# Patient Record
Sex: Female | Born: 1935 | Race: White | Hispanic: No | Marital: Single | Smoking: Former smoker
Health system: Southern US, Community
[De-identification: ages and names within clinical notes are randomized; demographics above are authoritative.]

## PROBLEM LIST (undated history)

## (undated) DIAGNOSIS — M8580 Other specified disorders of bone density and structure, unspecified site: Secondary | ICD-10-CM

## (undated) DIAGNOSIS — E079 Disorder of thyroid, unspecified: Secondary | ICD-10-CM

## (undated) DIAGNOSIS — M545 Low back pain, unspecified: Secondary | ICD-10-CM

## (undated) DIAGNOSIS — R002 Palpitations: Secondary | ICD-10-CM

## (undated) DIAGNOSIS — L409 Psoriasis, unspecified: Secondary | ICD-10-CM

## (undated) DIAGNOSIS — I4891 Unspecified atrial fibrillation: Secondary | ICD-10-CM

## (undated) DIAGNOSIS — G309 Alzheimer's disease, unspecified: Secondary | ICD-10-CM

## (undated) DIAGNOSIS — F0280 Dementia in other diseases classified elsewhere without behavioral disturbance: Secondary | ICD-10-CM

## (undated) DIAGNOSIS — I499 Cardiac arrhythmia, unspecified: Secondary | ICD-10-CM

## (undated) DIAGNOSIS — I639 Cerebral infarction, unspecified: Secondary | ICD-10-CM

## (undated) DIAGNOSIS — I10 Essential (primary) hypertension: Secondary | ICD-10-CM

## (undated) DIAGNOSIS — E785 Hyperlipidemia, unspecified: Secondary | ICD-10-CM

## (undated) HISTORY — DX: Other specified disorders of bone density and structure, unspecified site: M85.80

## (undated) HISTORY — DX: Hyperlipidemia, unspecified: E78.5

## (undated) HISTORY — DX: Low back pain: M54.5

## (undated) HISTORY — DX: Low back pain, unspecified: M54.50

## (undated) HISTORY — DX: Psoriasis, unspecified: L40.9

## (undated) HISTORY — PX: CLEFT PALATE REPAIR: SUR1165

## (undated) HISTORY — DX: Dementia in other diseases classified elsewhere, unspecified severity, without behavioral disturbance, psychotic disturbance, mood disturbance, and anxiety: F02.80

## (undated) HISTORY — DX: Cardiac arrhythmia, unspecified: I49.9

## (undated) HISTORY — DX: Disorder of thyroid, unspecified: E07.9

## (undated) HISTORY — DX: Palpitations: R00.2

## (undated) HISTORY — DX: Alzheimer's disease, unspecified: G30.9

---

## 1978-11-25 HISTORY — PX: MYOMECTOMY: SHX85

## 1997-11-25 HISTORY — PX: COLONOSCOPY: SHX174

## 2001-10-11 ENCOUNTER — Encounter: Payer: Self-pay | Admitting: Orthopedic Surgery

## 2001-10-11 ENCOUNTER — Encounter: Admission: RE | Admit: 2001-10-11 | Discharge: 2001-10-11 | Payer: Self-pay | Admitting: Orthopedic Surgery

## 2001-12-02 ENCOUNTER — Encounter: Admission: RE | Admit: 2001-12-02 | Discharge: 2001-12-02 | Payer: Self-pay | Admitting: Orthopedic Surgery

## 2001-12-02 ENCOUNTER — Encounter: Payer: Self-pay | Admitting: Orthopedic Surgery

## 2001-12-16 ENCOUNTER — Encounter: Admission: RE | Admit: 2001-12-16 | Discharge: 2001-12-16 | Payer: Self-pay | Admitting: Orthopedic Surgery

## 2001-12-16 ENCOUNTER — Encounter: Payer: Self-pay | Admitting: Orthopedic Surgery

## 2002-01-06 ENCOUNTER — Encounter: Payer: Self-pay | Admitting: Orthopedic Surgery

## 2002-01-06 ENCOUNTER — Encounter: Admission: RE | Admit: 2002-01-06 | Discharge: 2002-01-06 | Payer: Self-pay | Admitting: Orthopedic Surgery

## 2002-03-17 ENCOUNTER — Encounter: Payer: Self-pay | Admitting: Orthopedic Surgery

## 2002-03-18 ENCOUNTER — Inpatient Hospital Stay (HOSPITAL_COMMUNITY): Admission: RE | Admit: 2002-03-18 | Discharge: 2002-03-21 | Payer: Self-pay | Admitting: Orthopedic Surgery

## 2002-03-18 ENCOUNTER — Encounter: Payer: Self-pay | Admitting: Orthopedic Surgery

## 2002-07-07 ENCOUNTER — Encounter: Payer: Self-pay | Admitting: Emergency Medicine

## 2002-07-08 ENCOUNTER — Inpatient Hospital Stay (HOSPITAL_COMMUNITY): Admission: EM | Admit: 2002-07-08 | Discharge: 2002-07-13 | Payer: Self-pay | Admitting: Emergency Medicine

## 2002-07-08 ENCOUNTER — Encounter: Payer: Self-pay | Admitting: Orthopaedic Surgery

## 2002-07-13 ENCOUNTER — Inpatient Hospital Stay (HOSPITAL_COMMUNITY)
Admission: RE | Admit: 2002-07-13 | Discharge: 2002-07-19 | Payer: Self-pay | Admitting: Physical Medicine & Rehabilitation

## 2002-08-25 HISTORY — PX: ORIF HIP FRACTURE: SHX2125

## 2004-03-28 ENCOUNTER — Encounter: Admission: RE | Admit: 2004-03-28 | Discharge: 2004-03-28 | Payer: Self-pay | Admitting: Internal Medicine

## 2007-03-14 ENCOUNTER — Emergency Department (HOSPITAL_COMMUNITY): Admission: EM | Admit: 2007-03-14 | Discharge: 2007-03-14 | Payer: Self-pay | Admitting: Emergency Medicine

## 2007-04-14 ENCOUNTER — Encounter: Admission: RE | Admit: 2007-04-14 | Discharge: 2007-04-14 | Payer: Self-pay | Admitting: Internal Medicine

## 2008-01-19 ENCOUNTER — Emergency Department (HOSPITAL_COMMUNITY): Admission: EM | Admit: 2008-01-19 | Discharge: 2008-01-19 | Payer: Self-pay | Admitting: Emergency Medicine

## 2008-08-25 HISTORY — PX: CATARACT EXTRACTION: SUR2

## 2010-07-25 ENCOUNTER — Emergency Department: Payer: Self-pay | Admitting: Emergency Medicine

## 2010-12-16 ENCOUNTER — Encounter (HOSPITAL_BASED_OUTPATIENT_CLINIC_OR_DEPARTMENT_OTHER): Payer: Self-pay | Admitting: Internal Medicine

## 2011-02-24 DIAGNOSIS — I499 Cardiac arrhythmia, unspecified: Secondary | ICD-10-CM

## 2011-02-24 HISTORY — DX: Cardiac arrhythmia, unspecified: I49.9

## 2011-03-01 ENCOUNTER — Encounter: Payer: Self-pay | Admitting: Cardiovascular Disease

## 2011-03-02 ENCOUNTER — Encounter: Payer: Self-pay | Admitting: Cardiovascular Disease

## 2011-03-02 ENCOUNTER — Inpatient Hospital Stay: Payer: Self-pay | Admitting: Internal Medicine

## 2011-03-02 DIAGNOSIS — I498 Other specified cardiac arrhythmias: Secondary | ICD-10-CM

## 2011-03-02 DIAGNOSIS — I4891 Unspecified atrial fibrillation: Secondary | ICD-10-CM

## 2011-03-03 ENCOUNTER — Encounter: Payer: Self-pay | Admitting: Cardiovascular Disease

## 2011-03-11 ENCOUNTER — Telehealth: Payer: Self-pay | Admitting: Emergency Medicine

## 2011-03-11 NOTE — Telephone Encounter (Signed)
Pt's son called today stating that pt had to go back to the hospital and once pt was out she had a f/u with Dr.Patterson. Dr.Patterson put her on Pradaxa at this time. The son wanted me to let Dr.Gollan know this and pt has appt Friday 03/22/2011 @ 3.

## 2011-03-14 ENCOUNTER — Ambulatory Visit: Payer: PRIVATE HEALTH INSURANCE | Admitting: Cardiovascular Disease

## 2011-03-22 ENCOUNTER — Ambulatory Visit (INDEPENDENT_AMBULATORY_CARE_PROVIDER_SITE_OTHER): Payer: 59 | Admitting: Cardiovascular Disease

## 2011-03-22 ENCOUNTER — Encounter: Payer: Self-pay | Admitting: Cardiovascular Disease

## 2011-03-22 VITALS — BP 108/62 | HR 77 | Ht 66.0 in | Wt 146.4 lb

## 2011-03-22 DIAGNOSIS — I059 Rheumatic mitral valve disease, unspecified: Secondary | ICD-10-CM

## 2011-03-22 DIAGNOSIS — E785 Hyperlipidemia, unspecified: Secondary | ICD-10-CM | POA: Insufficient documentation

## 2011-03-22 DIAGNOSIS — I340 Nonrheumatic mitral (valve) insufficiency: Secondary | ICD-10-CM

## 2011-03-22 DIAGNOSIS — I4891 Unspecified atrial fibrillation: Secondary | ICD-10-CM | POA: Insufficient documentation

## 2011-03-22 NOTE — Progress Notes (Signed)
   Patient ID: Dawn Bradshaw, female    DOB: 02-14-1927, 77 y.o.   MRN: [id number]  HPI Comments: Ms. Balducci is a very pleasant 77 year old woman with chronic atrial fibrillation, recent admission to the hospital for abdominal pain on April 7 also a history of dementia, hypertension. In the hospital she was noted to have bradycardia and her atenolol was discontinued with improvement of her balance and her bradycardia. She presents to establish care. She was started on pradaxa in the hospital.  Overall, she reports doing well. She presents with her son. She currently lives at the [place]. Her balance is better since stopping the atenolol. She denies any significant shortness of breath or lightheadedness. She does not have any significant edema. She does have some fatigue and has been sleeping a lot though her son believes it is from the recent loss of her niece who is 58 years old and recently passed away. They were very close.  She does have a remote smoking history for 20 years ago quit.  Echocardiogram shows normal systolic function, severely dilated left atrium, moderately dilated right atrium, moderate to severe MR, moderate TR with mildly elevated right ventricular systolic pressures. Echocardiogram is read by Gavin Potters MD.  EKG today shows atrial fibrillation with ventricular rate 77 beats per minute with no significant ST or T wave changes.     Review of Systems  Constitutional: Positive for fatigue.  HENT: Negative.   Eyes: Negative.   Respiratory: Negative.   Cardiovascular: Negative.   Gastrointestinal: Negative.   Musculoskeletal: Positive for gait problem.  Skin: Negative.   Neurological: Negative.   Hematological: Negative.   Psychiatric/Behavioral: Negative.   All other systems reviewed and are negative.   BP 108/62  Pulse 77  Ht 5\' 6"  (1.676 m)  Wt 146 lb 6.4 oz (66.407 kg)  BMI 23.63 kg/m2   Physical Exam  Nursing note and vitals reviewed. Constitutional: She is  oriented to person, place, and time. She appears well-developed and well-nourished.  HENT:  Head: Normocephalic.  Nose: Nose normal.  Mouth/Throat: Oropharynx is clear and moist.  Eyes: Conjunctivae are normal. Pupils are equal, round, and reactive to light.  Neck: Normal range of motion. Neck supple. No JVD present.  Cardiovascular: Normal rate and intact distal pulses.  An irregularly irregular rhythm present. Exam reveals no gallop and no friction rub.   Murmur heard.  Crescendo systolic murmur is present with a grade of 2/6  Pulmonary/Chest: Effort normal and breath sounds normal. No respiratory distress. She has no wheezes. She has no rales. She exhibits no tenderness.  Abdominal: Soft. Bowel sounds are normal. She exhibits no distension. There is no tenderness.  Musculoskeletal: Normal range of motion. She exhibits no edema and no tenderness.  Lymphadenopathy:    She has no cervical adenopathy.  Neurological: She is alert and oriented to person, place, and time. Coordination normal.  Skin: Skin is warm and dry. No rash noted. No erythema.  Psychiatric: She has a normal mood and affect. Her behavior is normal. Judgment and thought content normal.         Assessment and Plan

## 2011-03-22 NOTE — Assessment & Plan Note (Addendum)
Echocardiogram read by outside physician in the hospital suggest significant mitral regurg, dilated left atrium. She is relatively asymptomatic at this time. On clinical exam, she appears to be well compensated raising the suspicion of an over estimation of her mitral valve regurgitation. I will try to look at these images myself at [facility]. No diuretic is needed at this time given that she appears well clinically.

## 2011-03-22 NOTE — Patient Instructions (Signed)
You are doing well. No medication changes were made. Monitor your heart rate.  Please call us if you have new issues that need to be addressed before your next appt.  We will call you for a follow up Appt. In 6 months

## 2011-03-22 NOTE — Assessment & Plan Note (Addendum)
Heart rate in atrial fibrillation is in an acceptable range. I suspect her heart rate does climb with exertion though I am hesitant to start even low-dose beta blocker given her profound bradycardia in the hospital. She is asymptomatic at this time with no shortness of breath with exertion. If her heart rate does climb with minimal exertion, and alternative could be low-dose digoxin every other day. Her blood pressure is also borderline low today which also makes it difficult to start low-dose beta blocker.  She is on pradaxa 150 mg b.i.d. I have asked her son to monitor her gait closely. If she starts to have falls, we may have to reconsider her anticoagulation.

## 2011-03-22 NOTE — Assessment & Plan Note (Signed)
We have suggested that she stay on her current dose of cholesterol medication.

## 2011-03-31 ENCOUNTER — Inpatient Hospital Stay: Payer: Self-pay | Admitting: Internal Medicine

## 2011-04-03 ENCOUNTER — Other Ambulatory Visit: Payer: Self-pay

## 2011-04-03 MED ORDER — ATORVASTATIN CALCIUM 10 MG PO TABS: 10.0000 mg | ORAL_TABLET | Freq: Every day | ORAL | Status: DC

## 2011-04-12 NOTE — Op Note (Signed)
[place]  Patient:    PENI, RUPARD Visit Number: [id number] MRN: [id number]          Service Type: SUR Location: [facility] 2899 [id number] Attending Physician:  Drema Pry Dictated by:   Jearld Adjutant, M.D. Proc. Date: 03/18/02 Admit Date:  03/18/2002   CC:         Barry Dienes. Eloise Harman, M.D.   Operative Report  PREOPERATIVE DIAGNOSIS:  Nondisplaced stress fracture, left femoral neck.  POSTOPERATIVE DIAGNOSIS:  Nondisplaced stress fracture, left femoral neck.  PROCEDURE:  In situ pinning of left femoral neck fracture with Ace cannulated cancellous screw 7.5 mm x 3.  SURGEON:  Jearld Adjutant, M.D.  ASSISTANT:  Anise Salvo. Chestine Spore, P.A.  ANESTHESIA:  General endotracheal.  CULTURES:  None.  DRAINS:  None.  ESTIMATED BLOOD LOSS:  Minimal.  PATHOLOGIC FINDINGS AND HISTORY:  Ms. Norenberg is a long-term patient of mine, a 77 year old female, who had the recent onset of excruciating hip pain walking very gingerly with a cane in both hands.  This started approximately 10 days ago.  She came to the office April 16.  No obvious fracture was noted on plain x-ray.  We got an MRI scan, which showed edema in the femoral neck consistent with a stress fracture, not aseptic necrosis, and therefore we elected to proceed with prophylactic pinning.  At surgery no untoward features were noted.  We placed three screws in a pyramidal fashion, two at the base, one at the apex, securing the femoral head and neck.  DESCRIPTION OF PROCEDURE:  With adequate anesthesia obtained using endotracheal technique, 1 g Ancef given IV prophylaxis, the patient was placed in the supine position on the fracture table with the left leg in the traction with internal rotation and the right leg up in a stirrup.  C-arm fluoroscopy confirmed positioning.  After standard prepping and draping of the left hip, we then placed three guide pins in the configuration desired, triangular,  apex superior in the base distal, with the appropriate length screws being measured off the guide pins.  We placed superiorly a 95 mm and inferiorly two 105 mm. C-arm fluoroscopy confirmed positioning of the pins within the head, across the fracture site, on both views.  We then pulled out the guide pins and then closed the entrance wounds with staples.  A compressive dressing was applied and the patient, having tolerated the procedure well, was awakened, taken to the recovery room in satisfactory condition, to be admitted for routine postoperative care. Dictated by:   Jearld Adjutant, M.D. Attending Physician:  Drema Pry DD:  03/18/02 TD:  03/18/02 Job: [id number]

## 2011-04-12 NOTE — Discharge Summary (Signed)
[place]. [facility]  Patient:    Dawn Bradshaw, Dawn Bradshaw Visit Number: [id number] MRN: [id number]          Service Type: SUR Location: 5000 5006 01 Attending Physician:  Drema Pry Dictated by:   Anise Salvo. Clark, P.A.-C. Admit Date:  03/18/2002 Discharge Date: 03/21/2002                             Discharge Summary  ADMISSION DIAGNOSIS:  Stress fracture, left femoral neck.  DISCHARGE DIAGNOSIS: Stress fracture, left femoral neck, status post percutaneous pinning.  HOSPITAL COURSE:  The patient was admitted to [facility] on March 18, 2002, taken to the operating room.  She was placed under general anesthesia. She was intubated and positioned on fracture table, and the left femoral neck was pinned through percutaneous approach with three Ace cannulated 6.5 screws, and the patient was taken to PACU in stable condition.  On March 19, 2002, postoperative day #1, she was having some left hip pain, but it was controlled with her PCA morphine.  She was otherwise stable.  Her dressing was dry.  There was no discharge or bleeding.  The wounds looked good.  Her lab values showed postoperative CBC with white count 7.8, hemoglobin 11.2, hematocrit 32.4.  This was changed from preoperatively at 12.5 and 36, and platelet count 235.  Her metabolic panel showed sodium 140, potassium 3.5, chloride 101, CO2 32, BUN 13, creatinine 0.9, glucose elevated at 146, and calcium 9.1.  PT, OT, rehabilitation, and discharge planning consults were obtained, and ambulation was encouraged with a walker and touchdown weightbearing, and she was encouraged to use p.o. pain medications.  On March 20, 2002, postoperative day #2, she was without complaints.  She had been out of bed to the chair.  Rehabilitation consult was recommending a short rehabilitation or subacute stay.  Her wounds remained benign.  She was afebrile with stable vital signs.  We attempted to wean her off  PCA, and her dressing was changed.  On postoperative day #3, March 21, 2002, she was having minimal pain.  She was able to ambulate with the walker, and she wanted to go home.  The wounds looked benign.  Vital signs were stable.  It was decided to discharge her home.  ACTIVITY:  Weightbearing as tolerated on the left leg, ambulating with a walker.  DIET:  Regular as tolerated.  WOUND CARE:  Keep wound clean and dry.  She may shower on Tuesday morning and may discontinue the dressing following her shower on Tuesday.  SPECIAL INSTRUCTIONS:  Call for increasing redness, pain, swelling, or drainage.  Call for fever greater than 101.5 or for chills.  DISCHARGE MEDICATIONS: 1. Percocet 325/5 mg 1 or 2 as needed q.4-6h. for pain. 2. Robaxin 500 mg 1 as needed every 6 hours for spasm or cramping. 3. Phenergan 25 mg 1 p.o. q.6h. as needed for nausea. 4. Laxative of choice as needed, directed as per label. 5. Resume home medications. 6. Enalapril/HCTZ  10/25 1 p.o. q.d. 7. Synthroid 0.05 mg 1 p.o. q.d. 8. Lipitor 10 mg 1 p.o. q.d. 9. Celebrex 200 mg 1 p.o. b.i.d.  FOLLOWUP:  Appointment Apr 02, 2002, Dr. Philipp Ovens office.  She is instructed to call for an appointment an was given the phone number.  CONDITION UPON DISCHARGE:  Improved. Dictated by:   Anise Salvo. Clark, P.A.-C. Attending Physician:  Drema Pry DD:  03/21/02 TD:  03/22/02 Job: [id number]

## 2011-04-12 NOTE — Op Note (Signed)
NAME:  Dawn Bradshaw, Dawn Bradshaw                          ACCOUNT NO.:  [id number]   MEDICAL RECORD NO.:  [id number]                   PATIENT TYPE:  INP   LOCATION:  5152                                 FACILITY:  [facility]   PHYSICIAN:  Lubertha Basque. Jerl Santos, M.D.             DATE OF BIRTH:  01-29-1927   DATE OF PROCEDURE:  07/08/2002  DATE OF DISCHARGE:                                 OPERATIVE REPORT   PREOPERATIVE DIAGNOSES:  1. Retained hardware, left hip.  2. Subtrochanteric fracture, left hip.   PROCEDURES:  1. Removal of hardware, left hip.  2. Open reduction and internal fixation, left hip subtrochanteric fracture.   ANESTHESIA:  General.   SURGEON:  Lubertha Basque. Jerl Santos, M.D.   ASSISTANT:  Prince Rome, P.A.   INDICATIONS:  The patient is a 77 year old woman about four months out from  percutaneous pinning of stress fracture of the left femoral neck.  Unfortunately, she was at home yesterday walking when she felt something  snap in her hip and she fell on the floor.  X-rays in the emergency room  revealed a subtrochanteric fracture roughly at the level of the screw heads.  She was offered stabilization.  Informed operative consent was obtained  after a discussion of the possible complications of, reaction to anesthesia,  infection, DVT, PE, death, nonunion, and need for additional surgery.   DESCRIPTION OF PROCEDURE:  The patient was taken to the operating suite,  where a general anesthetic was applied without difficulty.  She was  positioned supine on the fracture table and prepped and draped in the normal  sterile fashion.  A closed reduction was performed with traction.  After  administration of preop IV antibiotics, a lateral approach was taken to the  subtrochanteric region of the left hip.  Some superior dissection was  required to remove the three Ace cannulated screw, which came out easily.  She had some significant posterior displacement of the distal fragment and  with some great deal of difficulty, this was brought into proper alignment  with the proximal fragment.  A guidewire was placed in the femoral head with  a 135 degree guide.  This was found to be central on two views  fluoroscopically.  This was overreamed to a diameter of 90 mm, and a 90 mm  Synthes hip screw was placed with good fixation achieved.  A long six-hole  side plate was then affixed to this screw with bicortical purchase achieved  on each of the screws, thereby connecting the proximal and distal fragments.  We took the traction off prior to placing these screws to allow the fracture  to compress somewhat in the subtrochanteric region.  The wound was  irrigated.  We used fluoroscopy throughout the case to make appropriate  intraoperative decisions and read all these views myself.  The vastus  lateralis was reapproximated with 0 Vicryl in running fashion, followed by  reapproximation of fascia lata with #1 Vicryl in running fashion.  The  subcutaneous tissue was reapproximated with 0 and 2-0 undyed Vicryl in  interrupted fashion, followed by skin closure with staples. Adaptic was  applied to the wound, followed by dry gauze and tape.  Estimated blood loss  and intraoperative fluids can be obtained from the anesthesia records.   DISPOSITION:  The patient was extubated in the operating room and taken to  the recovery room in stable condition.  Plans were for her to be admitted to  the orthopedic surgery service for appropriate postop care to include  perioperative antibiotics and Coumadin for DVT prophylaxis.  She will be bed-  to-chair activity level for some time, as I am not sure she can really  maintain touchdown weightbearing status.  She understands that she may never  walk again, but at least this fixation will allow her to get out of bed and  sit in a chair.  The decision between this type of fixation and  intramedullary reconstruction now was difficult, but I was worried  that in  the reconstruction might not achieve decent fixation in the head, where I  had to remove three Ace cannulated screws in the same setting.                                               Lubertha Basque Jerl Santos, M.D.    PGD/MEDQ  D:  07/08/2002  T:  07/10/2002  Job:  [id number]

## 2011-04-12 NOTE — Discharge Summary (Signed)
   NAME:  Dawn Bradshaw, Dawn Bradshaw                          ACCOUNT NO.:  [id number]   MEDICAL RECORD NO.:  [id number]                   PATIENT TYPE:  IPS   LOCATION:  4140                                 FACILITY:  [facility]   PHYSICIAN:  Lubertha Basque. Jerl Santos, M.D.             DATE OF BIRTH:  1927/08/31   DATE OF ADMISSION:  07/13/2002  DATE OF DISCHARGE:  07/21/2002                                 DISCHARGE SUMMARY   ADMITTING DIAGNOSES:  1. Left hip femoral neck fracture.  2. Hypertension.  3. Hypercholesterolemia.  4. Osteoporosis.   DISCHARGE DIAGNOSES:  1. Left hip femoral neck fracture.  2. Hypertension.  3. Hypercholesterolemia.  4. Osteoporosis.   OPERATIONS:  Open reduction and internal fixation of left hip  subtrochanteric fracture.   BRIEF HISTORY:  The patient is a 77 year old female who some number of  months ago had a pinning for a stress fracture of her femoral neck on the  left side.  On the morning of admission, she felt a pop and pain in her hip,  was unable to bear weight, and fell at home.  On presentation to the  hospital, showed that she had a left hip femoral neck fracture on x-ray.  Discussed treatment options with the patient and decided to proceed with  open reduction and internal fixation.   PERTINENT LABORATORY AND X-RAY FINDINGS:  Fluoroscopy was used to document  placement of the ORIF materials.  She had old pins in place with the femoral  neck fracture.  Chest x-ray showed no active disease.   Last tested hemoglobin was 11.1.  Pro times were done as she was on low-dose  Coumadin protocol.   HOSPITAL COURSE:  The patient was admitted from the emergency room, taken to  the operating room.  Her hip was fixed--ORIF.  Postoperatively, she was on  low-dose Coumadin protocol, IV antibiotics 1 g q.8h. x3 doses, dressing  changed numerous times throughout her hospital stay.  The wound was benign.  She was out of bed with the help of physical therapy, to be  touchdown  weightbearing, and was discharged to rehab.   CONDITION ON DISCHARGE:  Improved.   FOLLOW UP:  She will remain on Coumadin for 30 days postoperatively.  Touchdown weightbearing.  Stitches or staples out two weeks postoperatively.     Prince Rome, P.A.                 Lubertha Basque Jerl Santos, M.D.    MRC/MEDQ  D:  08/16/2002  T:  08/18/2002  Job:  [id number]
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
## 2012-01-22 ENCOUNTER — Emergency Department: Payer: Self-pay | Admitting: Unknown Physician Specialty

## 2012-01-22 LAB — CBC
MCH: 29.9 pg (ref 26.0–34.0)
MCV: 89 fL (ref 80–100)
Platelet: 240 10*3/uL (ref 150–440)
RDW: 13.6 % (ref 11.5–14.5)
WBC: 7.3 10*3/uL (ref 3.6–11.0)

## 2012-01-22 LAB — COMPREHENSIVE METABOLIC PANEL
Anion Gap: 6 — ABNORMAL LOW (ref 7–16)
BUN: 26 mg/dL — ABNORMAL HIGH (ref 7–18)
Bilirubin,Total: 0.5 mg/dL (ref 0.2–1.0)
Chloride: 106 mmol/L (ref 98–107)
Co2: 30 mmol/L (ref 21–32)
Creatinine: 1.31 mg/dL — ABNORMAL HIGH (ref 0.60–1.30)
EGFR (African American): 50 — ABNORMAL LOW
EGFR (Non-African Amer.): 41 — ABNORMAL LOW
Potassium: 3.3 mmol/L — ABNORMAL LOW (ref 3.5–5.1)
SGOT(AST): 14 U/L — ABNORMAL LOW (ref 15–37)
SGPT (ALT): 17 U/L
Sodium: 142 mmol/L (ref 136–145)
Total Protein: 7 g/dL (ref 6.4–8.2)

## 2012-01-22 LAB — URINALYSIS, COMPLETE
Bilirubin,UR: NEGATIVE
Glucose,UR: NEGATIVE mg/dL (ref 0–75)
Ketone: NEGATIVE
Nitrite: NEGATIVE
Ph: 5 (ref 4.5–8.0)
RBC,UR: 4 /HPF (ref 0–5)
Squamous Epithelial: 8
WBC UR: 24 /HPF (ref 0–5)

## 2012-01-22 LAB — CK TOTAL AND CKMB (NOT AT ARMC): CK-MB: 0.7 ng/mL (ref 0.5–3.6)

## 2012-02-19 ENCOUNTER — Emergency Department (HOSPITAL_COMMUNITY): Payer: Medicare FFS

## 2012-02-19 ENCOUNTER — Encounter (HOSPITAL_COMMUNITY): Payer: Self-pay | Admitting: Emergency Medicine

## 2012-02-19 ENCOUNTER — Emergency Department (HOSPITAL_COMMUNITY)
Admission: EM | Admit: 2012-02-19 | Discharge: 2012-02-20 | Disposition: A | Discharge status: Home Health | Payer: Medicare FFS | Attending: Emergency Medicine | Admitting: Emergency Medicine

## 2012-02-19 DIAGNOSIS — I498 Other specified cardiac arrhythmias: Secondary | ICD-10-CM | POA: Insufficient documentation

## 2012-02-19 DIAGNOSIS — G309 Alzheimer's disease, unspecified: Secondary | ICD-10-CM | POA: Insufficient documentation

## 2012-02-19 DIAGNOSIS — F0280 Dementia in other diseases classified elsewhere without behavioral disturbance: Secondary | ICD-10-CM | POA: Insufficient documentation

## 2012-02-19 DIAGNOSIS — Z79899 Other long term (current) drug therapy: Secondary | ICD-10-CM | POA: Insufficient documentation

## 2012-02-19 DIAGNOSIS — F0390 Unspecified dementia without behavioral disturbance: Secondary | ICD-10-CM | POA: Insufficient documentation

## 2012-02-19 DIAGNOSIS — F29 Unspecified psychosis not due to a substance or known physiological condition: Secondary | ICD-10-CM | POA: Insufficient documentation

## 2012-02-19 DIAGNOSIS — I4891 Unspecified atrial fibrillation: Secondary | ICD-10-CM | POA: Insufficient documentation

## 2012-02-19 DIAGNOSIS — E119 Type 2 diabetes mellitus without complications: Secondary | ICD-10-CM | POA: Insufficient documentation

## 2012-02-19 DIAGNOSIS — E785 Hyperlipidemia, unspecified: Secondary | ICD-10-CM | POA: Insufficient documentation

## 2012-02-19 DIAGNOSIS — E039 Hypothyroidism, unspecified: Secondary | ICD-10-CM | POA: Insufficient documentation

## 2012-02-19 DIAGNOSIS — R350 Frequency of micturition: Secondary | ICD-10-CM | POA: Insufficient documentation

## 2012-02-19 HISTORY — DX: Unspecified atrial fibrillation: I48.91

## 2012-02-19 LAB — URINALYSIS, ROUTINE W REFLEX MICROSCOPIC
Hgb urine dipstick: NEGATIVE
Ketones, ur: NEGATIVE mg/dL
Leukocytes, UA: NEGATIVE
Protein, ur: NEGATIVE mg/dL
Specific Gravity, Urine: 1.012 (ref 1.005–1.030)

## 2012-02-19 NOTE — ED Provider Notes (Signed)
History     CSN: [id number]  Arrival date & time 02/19/12  2249   First MD Initiated Contact with Patient 02/19/12 2305      Chief Complaint  Patient presents with  . Urinary Tract Infection    (Consider location/radiation/quality/duration/timing/severity/associated sxs/prior treatment) Patient is a 78 y.o. female presenting with urinary tract infection. The history is provided by the EMS personnel. The history is limited by the condition of the patient.  Urinary Tract Infection This is a recurrent problem. Episode onset: unknown start. Episode frequency: unknown. The problem has not changed since onset.The symptoms are aggravated by nothing. She has tried nothing for the symptoms. The treatment provided no relief.  EMS reports patient is from home and has had frequent urination and increased confusion  Past Medical History  Diagnosis Date  . Hyperlipidemia   . Thyroid disease     hypothyroidism  . Psoriasis   . Osteopenia   . LBP (low back pain)     mild  . Palpitations     H/O  . Diabetes mellitus     Type II  . Alzheimer's disease     mild  . Arrhythmia 4/12    A-Fib @ [facility]  . Atrial fibrillation     Past Surgical History  Procedure Date  . Cleft palate repair   . Myomectomy 1980  . Colonoscopy 1999  . Orif hip fracture 08/2002  . Cataract extraction 08/2008    Family History  Problem Relation Age of Onset  . Heart disease Brother     History  Substance Use Topics  . Smoking status: Former Smoker    Types: Cigarettes    Quit date: 11/25/1948  . Smokeless tobacco: Never Used  . Alcohol Use: No    OB History    Grav Para Term Preterm Abortions TAB SAB Ect Mult Living                  Review of Systems  Unable to perform ROS   Allergies  Review of patient's allergies indicates no known allergies.  Home Medications   Current Outpatient Rx  Name Route Sig Dispense Refill  . ATORVASTATIN CALCIUM 10 MG PO TABS Oral Take 1 tablet (10 mg  total) by mouth daily. 30 tablet 3  . CALTRATE 600 PLUS-VIT D PO Oral Take 600 mg by mouth 2 (two) times daily.      Marland Kitchen DABIGATRAN ETEXILATE MESYLATE 150 MG PO CAPS Oral Take 150 mg by mouth every 12 (twelve) hours.      . DONEPEZIL HCL 10 MG PO TABS Oral Take 10 mg by mouth at bedtime as needed.      . ENALAPRIL MALEATE 10 MG PO TABS Oral Take 10 mg by mouth daily.      . OMEGA-3 FATTY ACIDS 1000 MG PO CAPS Oral Take 600 mg by mouth daily.      Marland Kitchen LEVOTHYROXINE SODIUM 25 MCG PO TABS Oral Take 25 mcg by mouth daily.      Marland Kitchen MEMANTINE HCL 10 MG PO TABS Oral Take 10 mg by mouth 2 (two) times daily.      . TROSPIUM CHLORIDE ER 60 MG PO CP24 Oral Take 60 mg by mouth 1 dose over 46 hours.        BP 179/100  Pulse 88  Temp(Src) 98 F (36.7 C) (Oral)  Resp 18  SpO2 98%  Physical Exam  Constitutional: She appears well-developed and well-nourished. No distress.  HENT:  Head: Normocephalic and  atraumatic.  Mouth/Throat: Oropharynx is clear and moist.  Eyes: Conjunctivae are normal. Pupils are equal, round, and reactive to light.  Neck: Normal range of motion. Neck supple.  Cardiovascular: Normal rate.  An irregular rhythm present.  Pulmonary/Chest: Effort normal and breath sounds normal.  Abdominal: Soft. Bowel sounds are normal. There is no tenderness. There is no rebound and no guarding.  Musculoskeletal: Normal range of motion.  Neurological: She is alert. She has normal reflexes.  Skin: Skin is warm and dry.  Psychiatric: She has a normal mood and affect.    ED Course  Procedures (including critical care time)  Labs Reviewed - No data to display No results found.   No diagnosis found.    MDM   Date: 02/20/2012  Rate:91  Rhythm: atrial fibrillation  QRS Axis: normal  Intervals: QT prolonged  ST/T Wave abnormalities: normal  Conduction Disutrbances:none  Narrative Interpretation:   Old EKG Reviewed: changes noted   No UTi, labs reassuring.  Follow up in the am with Dr.  Eloise Harman for ongoing care.  Return for worsening symptoms     Lynea Rollison K Annia Gomm-Rasch, MD 02/20/12 [phone]

## 2012-02-19 NOTE — ED Notes (Signed)
AVW:[id number]<XB> Expected date:02/19/12<BR> Expected time:<BR> Means of arrival:<BR> Comments:<BR> EMS 211 GC - UTI

## 2012-02-19 NOTE — ED Notes (Signed)
Pt had a UTI last month and is having frequent urination. Confused today. Didn't recognize her son and thought her cat was a person. Stumbled once. Neuro intact. Speech might be a little off per family. Hx of dementia. No pain. Hx of afib.

## 2012-02-20 ENCOUNTER — Other Ambulatory Visit: Payer: Self-pay

## 2012-02-20 LAB — POCT I-STAT, CHEM 8
BUN: 21 mg/dL (ref 6–23)
Calcium, Ion: 1.18 mmol/L (ref 1.12–1.32)
Chloride: 109 mEq/L (ref 96–112)
Potassium: 3.6 mEq/L (ref 3.5–5.1)
TCO2: 23 mmol/L (ref 0–100)

## 2012-02-20 LAB — DIFFERENTIAL
Basophils Relative: 0 % (ref 0–1)
Eosinophils Relative: 1 % (ref 0–5)
Lymphocytes Relative: 29 % (ref 12–46)
Lymphs Abs: 2.1 10*3/uL (ref 0.7–4.0)
Monocytes Relative: 8 % (ref 3–12)
Neutro Abs: 4.6 10*3/uL (ref 1.7–7.7)

## 2012-02-20 LAB — CBC: RBC: 4.62 MIL/uL (ref 3.87–5.11)

## 2012-02-20 LAB — POCT I-STAT TROPONIN I: Troponin i, poc: 0.02 ng/mL (ref 0.00–0.08)

## 2012-02-20 NOTE — ED Notes (Signed)
Pt not in the room at this time. Will get labs when pt returns.

## 2012-02-21 LAB — URINE CULTURE: Colony Count: NO GROWTH

## 2012-08-28 ENCOUNTER — Emergency Department (HOSPITAL_COMMUNITY)
Admission: EM | Admit: 2012-08-28 | Discharge: 2012-08-28 | Discharge status: Against Medical Advice | Payer: 59 | Attending: Emergency Medicine | Admitting: Emergency Medicine

## 2012-08-28 ENCOUNTER — Encounter (HOSPITAL_COMMUNITY): Payer: Self-pay

## 2012-08-28 DIAGNOSIS — R4182 Altered mental status, unspecified: Secondary | ICD-10-CM | POA: Insufficient documentation

## 2012-08-28 HISTORY — DX: Cerebral infarction, unspecified: I63.9

## 2012-08-28 HISTORY — DX: Essential (primary) hypertension: I10

## 2012-08-28 NOTE — ED Notes (Signed)
Per registration family came to desk and stated they were going to make an appt with her family dr and follow up with them  Encouraged to stay but chose to leave anyway

## 2012-08-28 NOTE — ED Notes (Signed)
Pt has dementia but has noticed to have increase altered mental status today with odorous urine. Reports memory loss, pt couldn't find her living room, get lost in her own horse and has been mentioning about a girl living with her but family member sts there is no girl. Pt is neurologically intact. No facial drooling, strong and equal muscle strength bilaterally, no arm drift. Pt in no acute distress.

## 2012-09-16 IMAGING — CT CT ABD-PELV W/ CM
1 of 2 series · 15 of 32 positions shown, 19 images · non-contrast
Comparison: none

REASON FOR EXAM: (1) abdominal pain; (2) abdominal pain
COMMENTS:

PROCEDURE:     CT  - CT ABDOMEN / PELVIS  W  - March 02, 2011  [DATE]
RESULT:
TECHNIQUE: Helical 3 mm sections were obtained from the lung bases through
the symphysis status post intravenous administration of 100 ml of Csovue-2KN
and oral contrast.

[Series 2: 3mm soft tissue · axial · 0.70mm/px · z∈[-452,-88]mm · 15 of 133 slices shown, 19 images]
[im 6/133  soft-tissue]
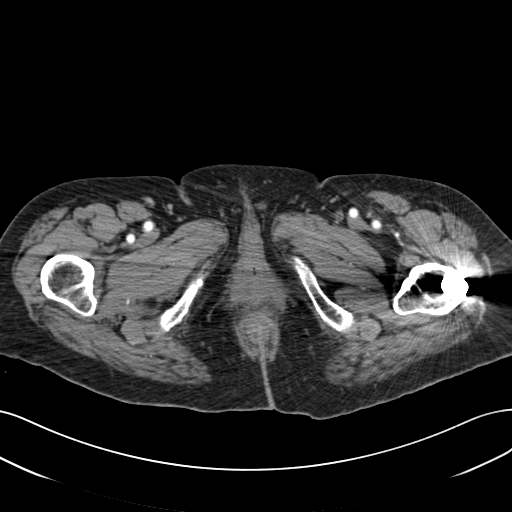
[im 6/133  bone]
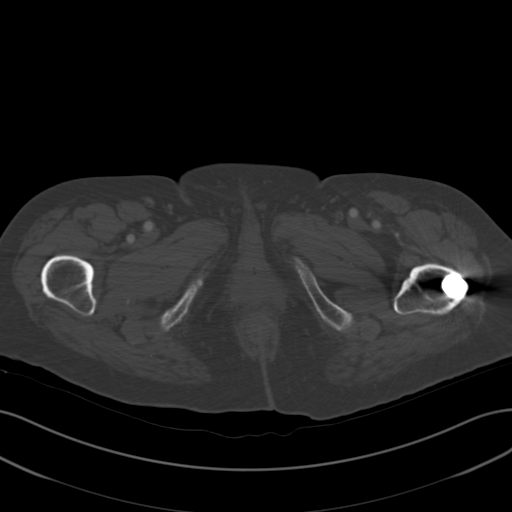
[im 17/133  soft-tissue]
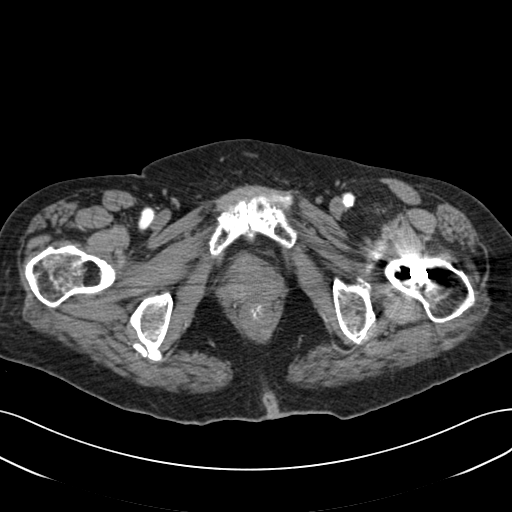
[im 28/133  soft-tissue]
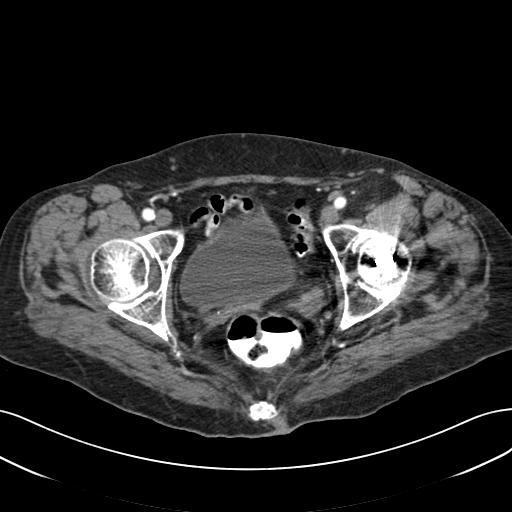
[im 39/133  soft-tissue]
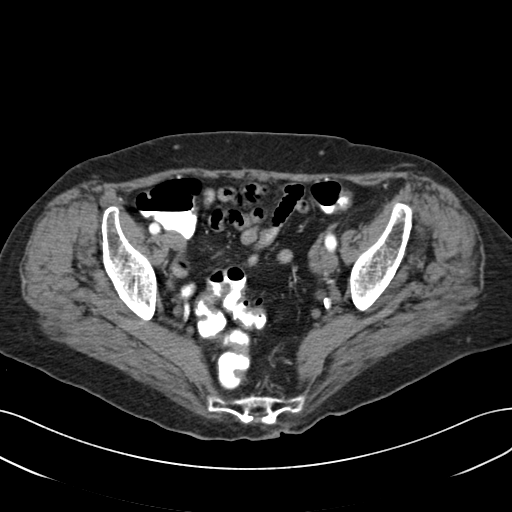
[im 45/133  soft-tissue]
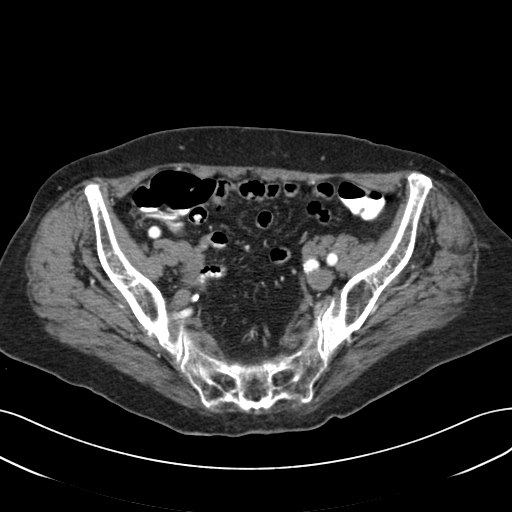
[im 56/133  soft-tissue]
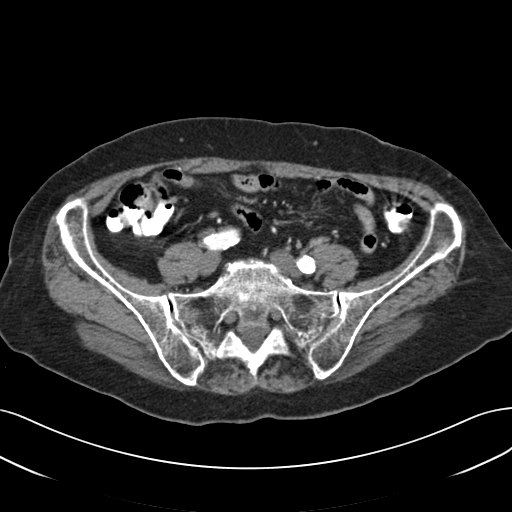
[im 67/133  soft-tissue]
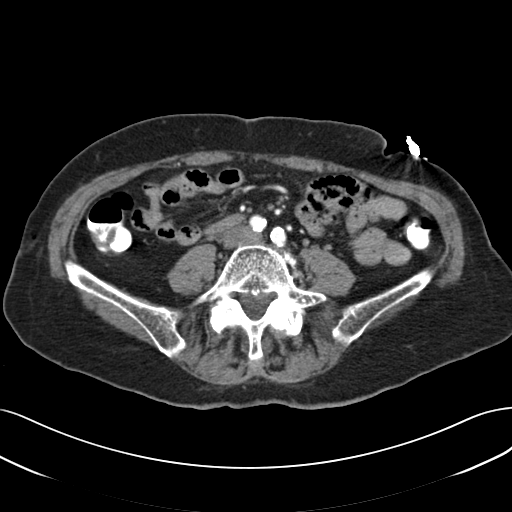
[im 78/133  soft-tissue]
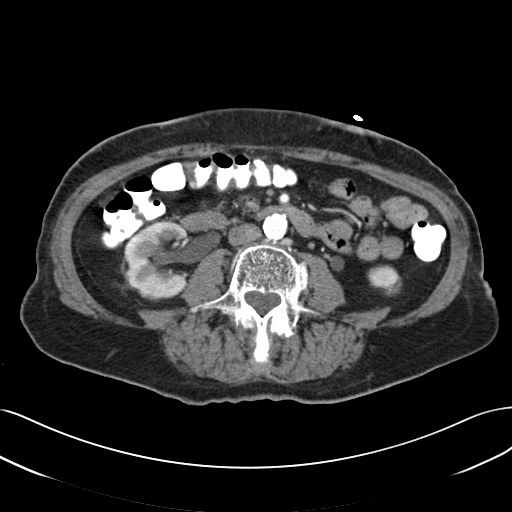
[im 89/133  soft-tissue]
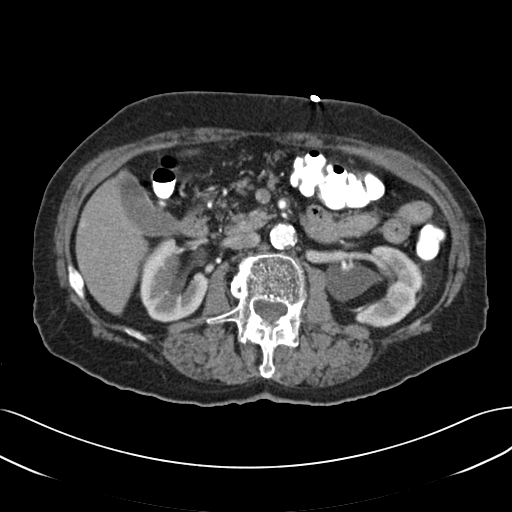
[im 89/133  bone]
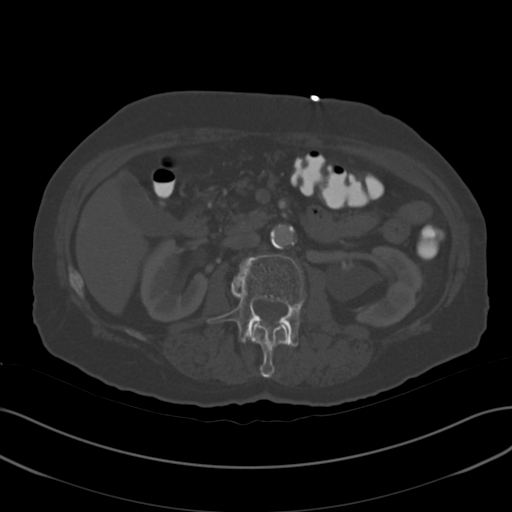
[im 94/133  soft-tissue]
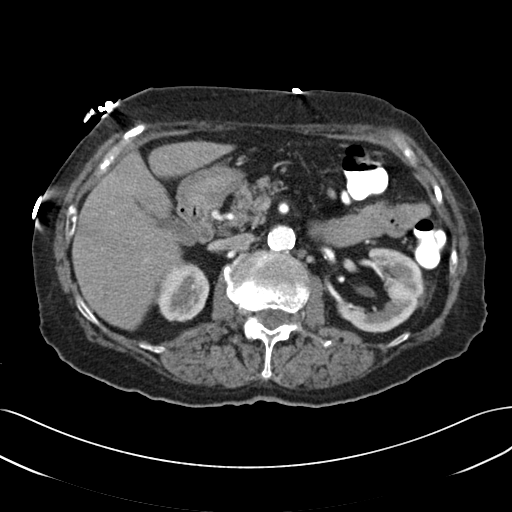
[im 105/133  soft-tissue]
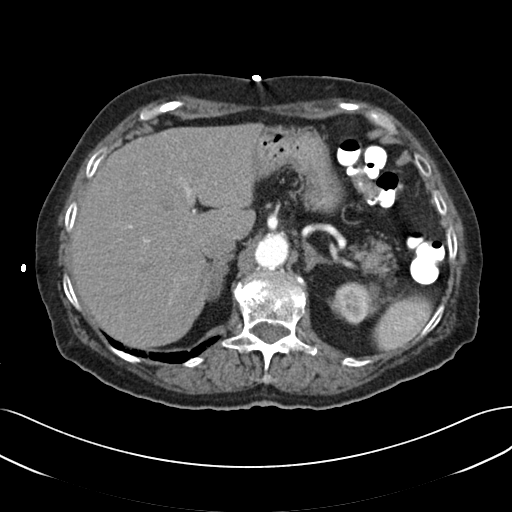
[im 111/133  lung]
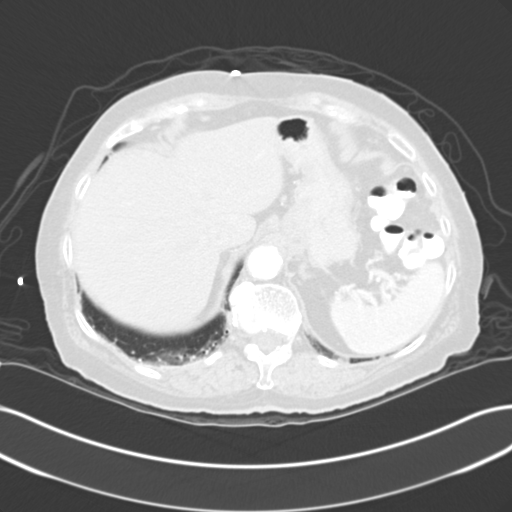
[im 116/133  soft-tissue]
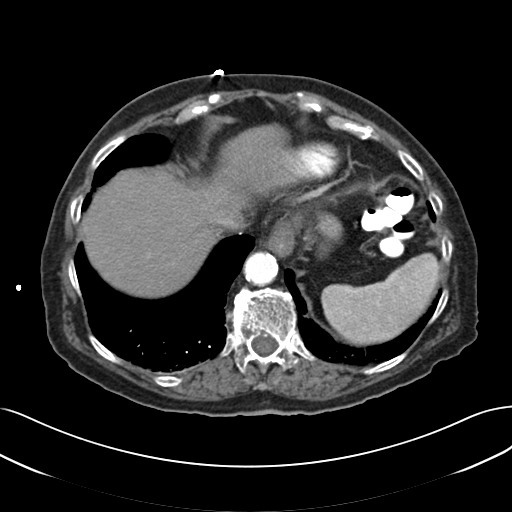
[im 116/133  lung]
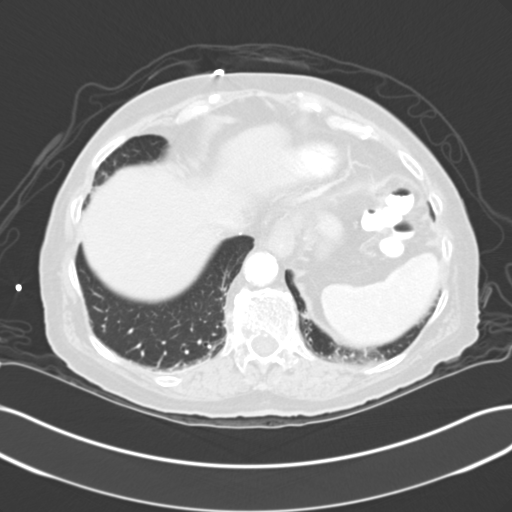
[im 122/133  lung]
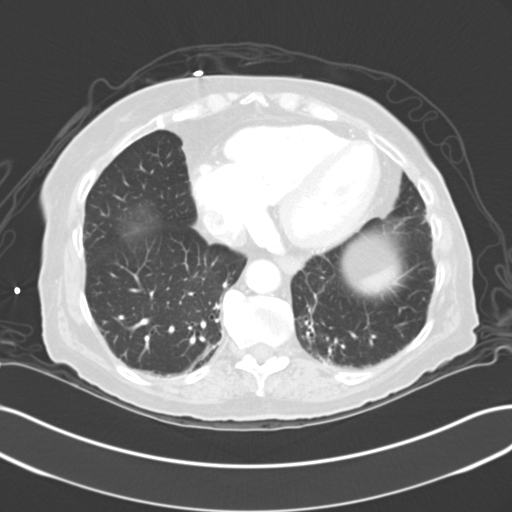
[im 127/133  soft-tissue]
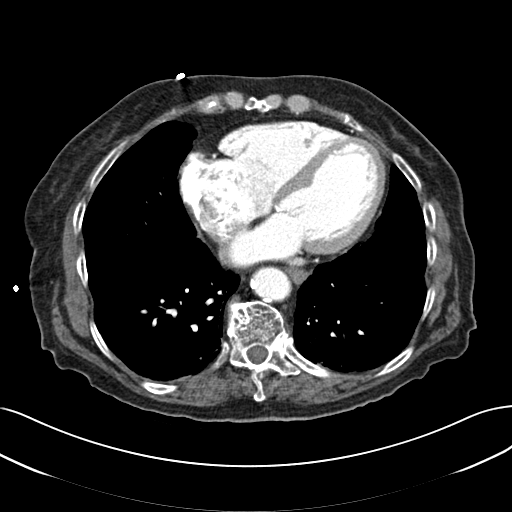
[im 127/133  lung]
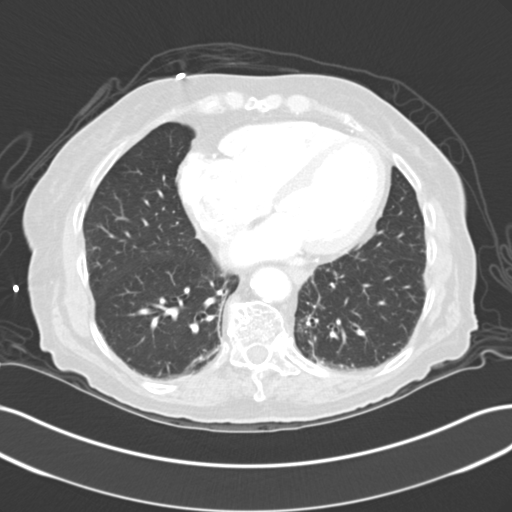

[15 of 32 positions shown; findings below may reference images not displayed]

FINDINGS: Hypoventilation is appreciated within the lung bases.

The liver, spleen, adrenals, and pancreas are unremarkable. Bilateral
extrarenal pelves are identified as well as what appears to be a small, 1 cm
left renal cyst.

There is no CT evidence of bowel obstruction nor secondary signs reflecting
enteritis, colitis, diverticulitis nor appendicitis.

There is no CT evidence of an abdominal aortic aneurysm. The celiac, SMA,
IMA, portal vein are opacified. There is no CT evidence of abdominal or
pelvic masses, free fluid nor loculated fluid collections.
IMPRESSION: No CT evidence of obstructive or inflammatory abnormalities.

## 2012-10-12 ENCOUNTER — Emergency Department: Payer: Self-pay | Admitting: Emergency Medicine

## 2012-10-12 LAB — CBC
HGB: 15.3 g/dL (ref 12.0–16.0)
MCH: 30.5 pg (ref 26.0–34.0)
MCHC: 34.1 g/dL (ref 32.0–36.0)
Platelet: 238 10*3/uL (ref 150–440)

## 2012-10-12 LAB — URINALYSIS, COMPLETE
Bilirubin,UR: NEGATIVE
Ph: 5 (ref 4.5–8.0)
RBC,UR: 1 /HPF (ref 0–5)
Squamous Epithelial: 2

## 2012-10-12 LAB — BASIC METABOLIC PANEL
Anion Gap: 5 — ABNORMAL LOW (ref 7–16)
BUN: 21 mg/dL — ABNORMAL HIGH (ref 7–18)
Chloride: 109 mmol/L — ABNORMAL HIGH (ref 98–107)
Creatinine: 1.23 mg/dL (ref 0.60–1.30)
Glucose: 116 mg/dL — ABNORMAL HIGH (ref 65–99)
Sodium: 143 mmol/L (ref 136–145)

## 2012-10-12 LAB — CK TOTAL AND CKMB (NOT AT ARMC)
CK, Total: 84 U/L (ref 21–215)
CK-MB: 1.2 ng/mL (ref 0.5–3.6)

## 2012-10-12 LAB — TROPONIN I: Troponin-I: <0.02 ng/mL

## 2013-02-08 ENCOUNTER — Encounter (HOSPITAL_COMMUNITY): Payer: Self-pay | Admitting: *Deleted

## 2013-02-08 ENCOUNTER — Emergency Department (HOSPITAL_COMMUNITY)
Admission: EM | Admit: 2013-02-08 | Discharge: 2013-02-09 | Disposition: A | Discharge status: Home / Self Care | Payer: Medicare PPO | Attending: Emergency Medicine | Admitting: Emergency Medicine

## 2013-02-08 ENCOUNTER — Emergency Department (HOSPITAL_COMMUNITY): Payer: Medicare PPO

## 2013-02-08 DIAGNOSIS — E039 Hypothyroidism, unspecified: Secondary | ICD-10-CM | POA: Insufficient documentation

## 2013-02-08 DIAGNOSIS — G309 Alzheimer's disease, unspecified: Secondary | ICD-10-CM | POA: Insufficient documentation

## 2013-02-08 DIAGNOSIS — Z87891 Personal history of nicotine dependence: Secondary | ICD-10-CM | POA: Insufficient documentation

## 2013-02-08 DIAGNOSIS — Z8679 Personal history of other diseases of the circulatory system: Secondary | ICD-10-CM | POA: Insufficient documentation

## 2013-02-08 DIAGNOSIS — M76899 Other specified enthesopathies of unspecified lower limb, excluding foot: Secondary | ICD-10-CM | POA: Insufficient documentation

## 2013-02-08 DIAGNOSIS — Y9301 Activity, walking, marching and hiking: Secondary | ICD-10-CM | POA: Insufficient documentation

## 2013-02-08 DIAGNOSIS — F0280 Dementia in other diseases classified elsewhere without behavioral disturbance: Secondary | ICD-10-CM | POA: Insufficient documentation

## 2013-02-08 DIAGNOSIS — M7062 Trochanteric bursitis, left hip: Secondary | ICD-10-CM

## 2013-02-08 DIAGNOSIS — I10 Essential (primary) hypertension: Secondary | ICD-10-CM | POA: Insufficient documentation

## 2013-02-08 DIAGNOSIS — W1789XA Other fall from one level to another, initial encounter: Secondary | ICD-10-CM | POA: Insufficient documentation

## 2013-02-08 DIAGNOSIS — Z872 Personal history of diseases of the skin and subcutaneous tissue: Secondary | ICD-10-CM | POA: Insufficient documentation

## 2013-02-08 DIAGNOSIS — Z8673 Personal history of transient ischemic attack (TIA), and cerebral infarction without residual deficits: Secondary | ICD-10-CM | POA: Insufficient documentation

## 2013-02-08 DIAGNOSIS — Y92009 Unspecified place in unspecified non-institutional (private) residence as the place of occurrence of the external cause: Secondary | ICD-10-CM | POA: Insufficient documentation

## 2013-02-08 DIAGNOSIS — Z79899 Other long term (current) drug therapy: Secondary | ICD-10-CM | POA: Insufficient documentation

## 2013-02-08 DIAGNOSIS — E785 Hyperlipidemia, unspecified: Secondary | ICD-10-CM | POA: Insufficient documentation

## 2013-02-08 MED ORDER — HYDROCODONE-ACETAMINOPHEN 5-325 MG PO TABS: 1.0000 | ORAL_TABLET | ORAL | Status: DC | PRN

## 2013-02-08 MED ORDER — HYDROCODONE-ACETAMINOPHEN 5-325 MG PO TABS
1.0000 | ORAL_TABLET | Freq: Once | ORAL | Status: AC
  Administered 2013-02-08: 1 via ORAL
  Filled 2013-02-08: qty 1

## 2013-02-08 NOTE — Progress Notes (Signed)
EDP consulted ED CM about pt for possible short term placement after hip fx Referred to ED SW

## 2013-02-08 NOTE — ED Notes (Addendum)
Per PTAR, pt with hx of dementia but comes from home, family reports that pt fell 3 days ago and has been complaining of increase in left hip pain. PTAR reports that pt can barely walk and due to hx of hip fracture twice, family is concerned that hip may be broken.

## 2013-02-08 NOTE — Progress Notes (Signed)
CSW spoke with [facility] Rep who stated that patient will need to choose facility before authorization can be started. SW met with pt and pt son to discuss bed offers. At this time, patient only bed offer is [facility] and pt and pt family agree for [place]. CSW spoke with EDP who agrees that patient needs snf placement. CSW left message for [facility] for snf choice to initiate snf authorization. CSW and EDP discussed Physical therapy to assist with [facility]. [place] awaiting follow up call from csw regarding status of [place].   Catha Gosselin, LCSWA  [phone] .02/08/2013 1608pm

## 2013-02-08 NOTE — ED Notes (Signed)
TCU not open per Consulting civil engineer, pt to remain in room 18.

## 2013-02-08 NOTE — ED Provider Notes (Addendum)
History     CSN: [id number]  Arrival date & time 02/08/13  1153   First MD Initiated Contact with Patient 02/08/13 1157      Chief Complaint  Patient presents with  . Fall  . Hip Pain    right    (Consider location/radiation/quality/duration/timing/severity/associated sxs/prior treatment) HPI Comments: Patient has history of Dementia and history is given primarily through the EMS personnel.  I am told that she fell two days ago, but had been ambulatory with pain before this morning.  This morning the pain became worse and she is now unable to ambulate.  She denies any other complaint.  EMS tells me she has broken this hip in the past but the patient does not have knowledge of this.    Patient is a 79 y.o. female presenting with fall and hip pain. The history is provided by the patient.  Fall The accident occurred 2 days ago. The fall occurred while walking. She fell from a height of 1 to 2 ft. She landed on carpet. There was no blood loss. The point of impact was the left hip. The pain is present in the left hip. The pain is moderate.  Hip Pain    Past Medical History  Diagnosis Date  . Hyperlipidemia   . Thyroid disease     hypothyroidism  . Psoriasis   . Osteopenia   . LBP (low back pain)     mild  . Palpitations     H/O  . Diabetes mellitus     Type II  . Alzheimer's disease     mild  . Arrhythmia 4/12    A-Fib @ [facility]  . Atrial fibrillation   . Stroke   . Hypertension     Past Surgical History  Procedure Laterality Date  . Cleft palate repair    . Myomectomy  1980  . Colonoscopy  1999  . Orif hip fracture  08/2002  . Cataract extraction  08/2008    Family History  Problem Relation Age of Onset  . Heart disease Brother     History  Substance Use Topics  . Smoking status: Former Smoker    Types: Cigarettes    Quit date: 11/25/1948  . Smokeless tobacco: Never Used  . Alcohol Use: No    OB History   Grav Para Term Preterm Abortions TAB SAB Ect  Mult Living                  Review of Systems  All other systems reviewed and are negative.    Allergies  Review of patient's allergies indicates no known allergies.  Home Medications   Current Outpatient Rx  Name  Route  Sig  Dispense  Refill  . atorvastatin (LIPITOR) 10 MG tablet   Oral   Take 1 tablet (10 mg total) by mouth daily.   30 tablet   3   . Calcium-Vitamin D (CALTRATE 600 PLUS-VIT D PO)   Oral   Take 600 mg by mouth daily.          . dabigatran (PRADAXA) 150 MG CAPS   Oral   Take 150 mg by mouth daily.          Marland Kitchen donepezil (ARICEPT) 10 MG tablet   Oral   Take 10 mg by mouth daily.          . enalapril (VASOTEC) 20 MG tablet   Oral   Take 20 mg by mouth daily.         Marland Kitchen  levothyroxine (SYNTHROID) 25 MCG tablet   Oral   Take 12.5 mcg by mouth daily.          . Trospium Chloride (SANCTURA XR) 60 MG CP24   Oral   Take 60 mg by mouth 1 dose over 46 hours.             BP 140/75  Pulse 87  Temp(Src) 98.3 F (36.8 C) (Oral)  Resp 16  SpO2 95%  Physical Exam  Nursing note and vitals reviewed. Constitutional: She is oriented to person, place, and time. She appears well-developed and well-nourished. No distress.  HENT:  Head: Normocephalic and atraumatic.  Neck: Normal range of motion. Neck supple.  Cardiovascular: Normal rate and regular rhythm.  Exam reveals no gallop and no friction rub.   No murmur heard. Pulmonary/Chest: Effort normal and breath sounds normal. No respiratory distress. She has no wheezes.  Abdominal: Soft. Bowel sounds are normal. She exhibits no distension. There is no tenderness.  Musculoskeletal: Normal range of motion.  Neurological: She is alert and oriented to person, place, and time.  Skin: Skin is warm and dry. She is not diaphoretic.    ED Course  Procedures (including critical care time)  Labs Reviewed - No data to display No results found.   No diagnosis found.    MDM  The patient presents  here after a fall at home two days ago.  She has been ambulatory until this morning when her pain worsened to the point that she cannot bear weight or walk.  She is ttp over the lateral aspect of the hip, however xrays today do not reveal a fracture.  I have discussed this with Dr. Janee Morn who is covering for Dr. Yisroel Ramming.  He agrees with my assessment that no further imaging is indicated.  The son states that she cannot walk at home, but there appears to be no medical reason for admission.  I have spoken with social work regarding assistance with placement in a ecf.  This is currently in process and will hopefully take place this evening.  If not, she will be placed in observation overnight and will happen in the AM.        Geoffery Lyons, MD 02/08/13 1546  Geoffery Lyons, MD 02/09/13 [phone]

## 2013-02-08 NOTE — ED Notes (Signed)
JXB:[id number]<GN> Expected date:<BR> Expected time:<BR> Means of arrival:<BR> Comments:<BR> 79yo-m/seizure

## 2013-02-08 NOTE — ED Notes (Signed)
Pt transported to TCU room 26 accompanied by Mariane Duval, NT with chart and personal belongings. Condition stable at time of transfer.

## 2013-02-08 NOTE — ED Notes (Signed)
MD at bedside. 

## 2013-02-08 NOTE — Progress Notes (Signed)
CSW met with son at bedside.  [facility] system was currently down and CSW unable to complete psychosocial report. Pt currently lives at home alone.  Son, Arlys John, is main support.  Pt has hx of falls and has had hardware placed in her hip due to a px fall.  Pt fell again last week and hit head on door jam.  Son states that pt has been unable to bear weight on her hip and cannot get around at home.  Pt does not have 24 hour supervision.  EDP, family and pt do not feel as if going home is safe for this pt.  Pt has [employer] Medicare who does not require a qualifying stay.  CSW faxed pt information out to [facility], [place] and [place].  Pt and son's first choice for rehab is [facility], [place].  A [employer] authorization will need to be obtained.  [employer] medicare rep is Alcario Drought [phone].  Her fax # is [phone].  Evening CSW will f/u with [facility] Medicare re: authorization.  CSW will also request evening CSW to f/u with bed offers, pt and pt son.  Vickii Penna, LCSWA [phone]  Clinical Social Work

## 2013-02-08 NOTE — ED Notes (Signed)
Patient transported to X-ray 

## 2013-02-08 NOTE — ED Notes (Signed)
CSW continues to work on nursing home placement for rehab, may be tomorrow until pt can be placed, Dr. Judd Lien aware, will monitor.

## 2013-02-08 NOTE — Progress Notes (Signed)
CSW received consult from EDP re: pt needing possible placement.  CSW will f/u with pt and family.  Vickii Penna, LCSWA [phone]  Clinical Social Work

## 2013-02-09 MED ORDER — CALCIUM CARBONATE-VITAMIN D 500-200 MG-UNIT PO TABS
1.0000 | ORAL_TABLET | Freq: Every day | ORAL | Status: DC
  Administered 2013-02-09: 1 via ORAL
  Filled 2013-02-09: qty 1

## 2013-02-09 MED ORDER — DARIFENACIN HYDROBROMIDE ER 7.5 MG PO TB24
7.5000 mg | ORAL_TABLET | Freq: Every day | ORAL | Status: DC
  Administered 2013-02-09: 7.5 mg via ORAL
  Filled 2013-02-09: qty 1

## 2013-02-09 MED ORDER — DONEPEZIL HCL 10 MG PO TABS
10.0000 mg | ORAL_TABLET | Freq: Every day | ORAL | Status: DC
  Filled 2013-02-09: qty 1

## 2013-02-09 MED ORDER — LOSARTAN POTASSIUM 50 MG PO TABS
100.0000 mg | ORAL_TABLET | Freq: Every day | ORAL | Status: DC
  Filled 2013-02-09: qty 2

## 2013-02-09 MED ORDER — LOSARTAN POTASSIUM 50 MG PO TABS
50.0000 mg | ORAL_TABLET | Freq: Every day | ORAL | Status: DC
  Administered 2013-02-09: 50 mg via ORAL
  Filled 2013-02-09: qty 1

## 2013-02-09 MED ORDER — DABIGATRAN ETEXILATE MESYLATE 150 MG PO CAPS
150.0000 mg | ORAL_CAPSULE | Freq: Two times a day (BID) | ORAL | Status: DC
  Administered 2013-02-09: 150 mg via ORAL
  Filled 2013-02-09 (×2): qty 1

## 2013-02-09 MED ORDER — LEVOTHYROXINE SODIUM 25 MCG PO TABS
12.5000 ug | ORAL_TABLET | Freq: Every day | ORAL | Status: DC
  Administered 2013-02-09: 12.5 ug via ORAL
  Filled 2013-02-09 (×2): qty 0.5

## 2013-02-09 MED ORDER — HYDROCHLOROTHIAZIDE 12.5 MG PO CAPS: 12.5000 mg | ORAL_CAPSULE | Freq: Once | ORAL | Status: DC

## 2013-02-09 MED ORDER — HYDROCHLOROTHIAZIDE 10 MG/ML ORAL SUSPENSION
6.2500 mg | Freq: Every day | ORAL | Status: DC
  Filled 2013-02-09: qty 1.25

## 2013-02-09 MED ORDER — LEVOTHYROXINE SODIUM 25 MCG PO TABS: 25.0000 ug | ORAL_TABLET | Freq: Every day | ORAL | Status: DC

## 2013-02-09 NOTE — Progress Notes (Signed)
[facility] ED CM assessed pt and family Arlys John and Burna Mortimer) in [place] ED #26 The pt and family reports that since the pt has worked with [facility] PT this am "we think that we can take her home"  Arlys John, son, reports staying with the pt for "fourteen years" and agrees to be primary caregiver.  CM answered questions about [employer] Medicare coverage for a rolator and home health services Cm reviewed home health services related to coverage, length of stay in the home, initial [facility] assessment, and list of available staff services (PT/OT/aide/SW).  CM reviewed DME guidelines per most home health agencies for medicare patients CM informed by pt/family that her cane, rolling walker and shower chair have all been purchased privately or borrowed from family members.  CM provided a list of [place] home health agencies to assist with their choice Pt and family choose 1)[facility], [facility]) [facility] and 3) [facility].  CM spoke with Darl Pikes of [facility] to provide home health services referral [facility] will not be able to provided DME to [facility]  pt.  CM updated and requested orders from Dr Adriana Simas, EDP CM updated ED RN that CM has completed the CM consult with pt and family  Choices offered  HOME HEALTH AGENCIES SERVING [place]   Agencies that are Medicare-Certified and are affiliated with The [facility] Home Health Agency  Telephone Number Address  [facility]   The [facility] has ownership interest in this company; however, you are under no obligation to use this agency. [phone] or  [phone] [address] [place], [place] [address] [email or web address]   Agencies that are Medicare-Certified and are not affiliated with The [facility] Agency Telephone Number Address  [facility] [phone] Fax [phone] [address]  [address] [place], [place]  [address] [email or web address]  [facility] [phone] or [phone] Fax [phone] [address] [place], [place] [address] [email or web address]  [facility] [phone] Fax [phone] [address] [place], [place] [address] [email or web address]  [facility] [phone] Fax [phone] [address] [place], [place]  [address] [email or web address]  [facility] [phone] Fax [phone] [address] Suite [place], [place] [address] [email or web address]  [facility] [phone] [address] [place], [place] [address] NationalDirectors.dk  [facility] [phone]  [address] [place], [place] [address] [email or web address]  [facility] [phone] or [phone] Fax number [phone] [address] [place], [place]  [address] [email or web address]  [facility] [phone] Fax [phone] [address] [place], [place]  [address]  [facility]  [phone] Fax [phone] [address] [place], [place] [address] [email or web address]

## 2013-02-09 NOTE — Progress Notes (Signed)
CM spoke with Joe at [facility] [phone] who reviewed a list of DME companies to provide rolator [employer] noted CM spoke with local [employer] staff members who request order to be fax to [employer] customer service for processing for deliver to home CM updated pt and family Pending EPIC order or Rx from EDP, [employer]

## 2013-02-09 NOTE — Evaluation (Signed)
Physical Therapy Evaluation Patient Details Name: Dawn Bradshaw MRN: [id number] DOB: 14-Sep-1927 Today's Date: 02/09/2013 Time: 4098-1191 PT Time Calculation (min): 25 min  PT Assessment / Plan / Recommendation Clinical Impression  Pt presented to ER after fall at home with resulting L hip pain and inability to ambulate due to pain.  Son reports pt fell due to LOB while wearing socks on tile floor.  Pt would benefit from acute PT services in order to improve independence with transfers and ambulation by improving activity tolerance and strengthening LEs.  Family reports pt usually ambulatory with RW at home.  Recommend ST-SNF prior to home due to decreased mobility, pain in L LE, and increased assist for mobility.    PT Assessment  Patient needs continued PT services    Follow Up Recommendations  Supervision/Assistance - 24 hour;SNF    Does the patient have the potential to tolerate intense rehabilitation      Barriers to Discharge        Equipment Recommendations  Wheelchair (measurements PT)    Recommendations for Other Services     Frequency Min 3X/week    Precautions / Restrictions Precautions Precautions: Fall   Pertinent Vitals/Pain Pt reports pain in L hip with movement and ambulation (not rated).  RN reports pt has pain meds available but has not taken any.      Mobility  Bed Mobility Bed Mobility: Rolling Right;Supine to Sit;Sit to Supine Rolling Right: 4: Min assist;With rail Supine to Sit: 4: Min assist;HOB flat Sit to Supine: 4: Min assist;HOB flat Details for Bed Mobility Assistance: assist for end position into sidelying for use of bed pan as pt reports need to void (no BSC available in ED) Transfers Transfers: Sit to Stand;Stand to Sit Sit to Stand: 3: Mod assist;With upper extremity assist;From toilet;From bed Stand to Sit: With upper extremity assist;To bed;To toilet Details for Transfer Assistance: mod assist from low toilet (needed to void again after  ambulation) however min/guard from high bed, verbal cues for safety with RW as well as feet placement Ambulation/Gait Ambulation/Gait Assistance: 4: Min assist Ambulation Distance (Feet): 40 Feet Assistive device: Rolling walker Ambulation/Gait Assistance Details: 40 feet total, verbal cues for sequence, step length, pushing down through RW to assist with pain in L LE Gait Pattern: Step-to pattern;Decreased stance time - left;Antalgic Gait velocity: decreased General Gait Details: difficulty advancing L LE so pt lead with R LE    Exercises     PT Diagnosis: Difficulty walking;Acute pain  PT Problem List: Decreased strength;Decreased activity tolerance;Decreased mobility;Decreased balance;Decreased knowledge of use of DME;Pain PT Treatment Interventions: DME instruction;Gait training;Functional mobility training;Therapeutic activities;Therapeutic exercise;Patient/family education;Neuromuscular re-education;Balance training   PT Goals Acute Rehab PT Goals PT Goal Formulation: With patient/family Time For Goal Achievement: 02/16/13 Potential to Achieve Goals: Good Pt will go Supine/Side to Sit: with supervision PT Goal: Supine/Side to Sit - Progress: Goal set today Pt will go Sit to Supine/Side: with supervision PT Goal: Sit to Supine/Side - Progress: Goal set today Pt will go Sit to Stand: with supervision PT Goal: Sit to Stand - Progress: Goal set today Pt will go Stand to Sit: with supervision PT Goal: Stand to Sit - Progress: Goal set today Pt will Ambulate: 51 - 150 feet;with supervision;with rolling walker PT Goal: Ambulate - Progress: Goal set today Pt will Perform Home Exercise Program: with supervision, verbal cues required/provided PT Goal: Perform Home Exercise Program - Progress: Goal set today  Visit Information  Last PT Received On: 02/09/13 Assistance  Needed: +1    Subjective Data  Subjective: I need to pee.   Prior Functioning  Home Living Lives With:  Family Available Help at Discharge: Family Type of Home: House Home Layout: One level Home Adaptive Equipment: Walker - rolling;Straight cane;Bedside commode/3-in-1 Prior Function Level of Independence: Independent with assistive device(s) Communication Communication: No difficulties    Cognition  Cognition Overall Cognitive Status: History of cognitive impairments - at baseline Arousal/Alertness: Awake/alert Behavior During Session: [facility] for tasks performed Cognition - Other Comments: hx of dementia, family reports confusion today    Extremity/Trunk Assessment Right Upper Extremity Assessment RUE ROM/Strength/Tone: [facility] for tasks assessed Left Upper Extremity Assessment LUE ROM/Strength/Tone: WFL for tasks assessed Right Lower Extremity Assessment RLE ROM/Strength/Tone: [facility] for tasks assessed Left Lower Extremity Assessment LLE ROM/Strength/Tone: Deficits LLE ROM/Strength/Tone Deficits: assist for hip/knee flexion and abduction laying supine due to pain   Balance    End of Session PT - End of Session Equipment Utilized During Treatment: Gait belt Activity Tolerance: Patient limited by pain Patient left: in bed;with call bell/phone within reach;with family/visitor present  GP     Jaelan Rasheed,KATHrine E 02/09/2013, 12:44 PM Zenovia Jarred, PT, DPT 02/09/2013 Pager: [phone]

## 2013-02-09 NOTE — Progress Notes (Signed)
[facility] ED CM consulted by ED SW for home health and DME needs CM to assess pt and family

## 2013-02-09 NOTE — ED Notes (Signed)
PT at bedside.

## 2013-02-09 NOTE — ED Notes (Signed)
Pt escorted to discharge window. Verbalized understanding discharge instructions. In no acute distress.   

## 2013-02-09 NOTE — ED Provider Notes (Signed)
History     CSN: [id number]  Arrival date & time 02/08/13  1153   First MD Initiated Contact with Patient 02/08/13 1157      Chief Complaint  Patient presents with  . Fall  . Hip Pain    right    (Consider location/radiation/quality/duration/timing/severity/associated sxs/prior treatment) HPI... Dr Judd Lien is attending  Past Medical History  Diagnosis Date  . Hyperlipidemia   . Thyroid disease     hypothyroidism  . Psoriasis   . Osteopenia   . LBP (low back pain)     mild  . Palpitations     H/O  . Diabetes mellitus     Type II  . Alzheimer's disease     mild  . Arrhythmia 4/12    A-Fib @ [facility]  . Atrial fibrillation   . Stroke   . Hypertension     Past Surgical History  Procedure Laterality Date  . Cleft palate repair    . Myomectomy  1980  . Colonoscopy  1999  . Orif hip fracture  08/2002  . Cataract extraction  08/2008    Family History  Problem Relation Age of Onset  . Heart disease Brother     History  Substance Use Topics  . Smoking status: Former Smoker    Types: Cigarettes    Quit date: 11/25/1948  . Smokeless tobacco: Never Used  . Alcohol Use: No    OB History   Grav Para Term Preterm Abortions TAB SAB Ect Mult Living                  Review of Systems  Allergies  Review of patient's allergies indicates no known allergies.  Home Medications   Current Outpatient Rx  Name  Route  Sig  Dispense  Refill  . acetaminophen (TYLENOL) 500 MG tablet   Oral   Take 500 mg by mouth every 8 (eight) hours as needed for pain.         . Calcium-Vitamin D (CALTRATE 600 PLUS-VIT D PO)   Oral   Take 600 mg by mouth daily.          . dabigatran (PRADAXA) 150 MG CAPS   Oral   Take 150 mg by mouth daily.          Marland Kitchen donepezil (ARICEPT) 10 MG tablet   Oral   Take 10 mg by mouth daily.          Marland Kitchen levothyroxine (SYNTHROID) 25 MCG tablet   Oral   Take 12.5 mcg by mouth daily.          Marland Kitchen losartan-hydrochlorothiazide (HYZAAR)  100-12.5 MG per tablet   Oral   Take 0.5 tablets by mouth daily.         . Trospium Chloride (SANCTURA XR) 60 MG CP24   Oral   Take 60 mg by mouth daily.            BP 111/71  Pulse 89  Temp(Src) 98.4 F (36.9 C) (Oral)  Resp 16  SpO2 97%  Physical Exam  ED Course  Procedures (including critical care time)  Labs Reviewed - No data to display Dg Hip Complete Left  02/08/2013  *RADIOLOGY REPORT*  Clinical Data: Recent traumatic injury with hip pain  LEFT HIP - COMPLETE 2+ VIEW  Comparison: None.  Findings: Postoperative changes are noted in the proximal left femur with a fixation side plate and compression screw traversing the femoral neck.  Hardware failure is noted.  No definitive  acute fracture is seen.  Pelvic ring is intact.  Mild degenerate changes of the pubic symphysis are seen.  No soft tissue abnormality is noted.  IMPRESSION: Postsurgical changes.  No acute abnormality is seen.   Original Report Authenticated By: Alcide Clever, M.D.      1. Trochanteric bursitis, left       MDM  Dr. Judd Lien is attending        Donnetta Hutching, MD 03/01/13 [phone]

## 2013-02-10 NOTE — Progress Notes (Signed)
02/09/13 1243  PT G-Codes **NOT FOR INPATIENT CLASS**  Functional Assessment Tool Used clinical judgement  Functional Limitation Mobility: Walking and moving around  Mobility: Walking and Moving Around Current Status (Z6109) CK  Mobility: Walking and Moving Around Goal Status (U0454) CI  PT General Charges  $$ ACUTE PT VISIT 1 Procedure  PT Evaluation  $Initial PT Evaluation Tier I 1 Procedure  PT Treatments  $Gait Training 8-22 mins  $Therapeutic Activity 8-22 mins   Zenovia Jarred, PT, DPT 02/10/2013 Pager: [phone]

## 2013-02-17 NOTE — Progress Notes (Signed)
[facility] ED CM received a fax from [place] Dated Monday, 02/15/13 4:09"81 am stating "after several attempts to make patient contact we have had no return calls from messages left on family voice mail .Marland Kitchen At this time [name] is pending the order til patient or family contact has been made ... Tahnk You" from fax number [phone] [facility]

## 2013-06-05 ENCOUNTER — Emergency Department: Payer: Self-pay | Admitting: Unknown Physician Specialty

## 2013-06-05 LAB — TSH: Thyroid Stimulating Horm: 0.584 u[IU]/mL

## 2013-06-05 LAB — URINALYSIS, COMPLETE
Bacteria: NONE SEEN
Bilirubin,UR: NEGATIVE
Blood: NEGATIVE
Glucose,UR: NEGATIVE mg/dL (ref 0–75)
Ph: 7 (ref 4.5–8.0)
RBC,UR: 4 /HPF (ref 0–5)
Specific Gravity: 1.016 (ref 1.003–1.030)
Squamous Epithelial: 1
WBC UR: 2 /HPF (ref 0–5)

## 2013-06-05 LAB — CBC
HCT: 39.1 % (ref 35.0–47.0)
HGB: 13.4 g/dL (ref 12.0–16.0)
MCV: 87 fL (ref 80–100)
Platelet: 240 10*3/uL (ref 150–440)
RBC: 4.49 10*6/uL (ref 3.80–5.20)

## 2013-06-05 LAB — BASIC METABOLIC PANEL
Anion Gap: 4 — ABNORMAL LOW (ref 7–16)
BUN: 21 mg/dL — ABNORMAL HIGH (ref 7–18)
Calcium, Total: 9.2 mg/dL (ref 8.5–10.1)
Chloride: 107 mmol/L (ref 98–107)
Co2: 30 mmol/L (ref 21–32)
Creatinine: 1.21 mg/dL (ref 0.60–1.30)
EGFR (African American): 48 — ABNORMAL LOW
EGFR (Non-African Amer.): 41 — ABNORMAL LOW
Glucose: 125 mg/dL — ABNORMAL HIGH (ref 65–99)
Osmolality: 286 (ref 275–301)
Sodium: 141 mmol/L (ref 136–145)

## 2013-06-05 LAB — PROTIME-INR: INR: 1.2

## 2013-10-01 ENCOUNTER — Inpatient Hospital Stay (HOSPITAL_COMMUNITY): Payer: Medicare PPO

## 2013-10-01 ENCOUNTER — Encounter (HOSPITAL_COMMUNITY)
Admission: EM | Disposition: A | Discharge status: Skilled Nursing Facility | Payer: Self-pay | Source: Home / Self Care | Attending: Internal Medicine

## 2013-10-01 ENCOUNTER — Emergency Department (HOSPITAL_COMMUNITY): Payer: Medicare PPO

## 2013-10-01 ENCOUNTER — Inpatient Hospital Stay (HOSPITAL_COMMUNITY): Payer: Medicare PPO | Admitting: Anesthesiology

## 2013-10-01 ENCOUNTER — Encounter (HOSPITAL_COMMUNITY): Payer: Self-pay | Admitting: Emergency Medicine

## 2013-10-01 ENCOUNTER — Encounter (HOSPITAL_COMMUNITY): Payer: Medicare PPO | Admitting: Anesthesiology

## 2013-10-01 ENCOUNTER — Inpatient Hospital Stay (HOSPITAL_COMMUNITY)
Admission: EM | Admit: 2013-10-01 | Discharge: 2013-10-05 | DRG: 482 | Disposition: A | Discharge status: Skilled Nursing Facility | Payer: Medicare PPO | Attending: Internal Medicine | Admitting: Internal Medicine

## 2013-10-01 DIAGNOSIS — Z7901 Long term (current) use of anticoagulants: Secondary | ICD-10-CM

## 2013-10-01 DIAGNOSIS — S7291XA Unspecified fracture of right femur, initial encounter for closed fracture: Secondary | ICD-10-CM

## 2013-10-01 DIAGNOSIS — S72009A Fracture of unspecified part of neck of unspecified femur, initial encounter for closed fracture: Secondary | ICD-10-CM

## 2013-10-01 DIAGNOSIS — G309 Alzheimer's disease, unspecified: Secondary | ICD-10-CM | POA: Diagnosis present

## 2013-10-01 DIAGNOSIS — F0390 Unspecified dementia without behavioral disturbance: Secondary | ICD-10-CM

## 2013-10-01 DIAGNOSIS — S7290XA Unspecified fracture of unspecified femur, initial encounter for closed fracture: Secondary | ICD-10-CM

## 2013-10-01 DIAGNOSIS — W010XXA Fall on same level from slipping, tripping and stumbling without subsequent striking against object, initial encounter: Secondary | ICD-10-CM | POA: Diagnosis present

## 2013-10-01 DIAGNOSIS — E119 Type 2 diabetes mellitus without complications: Secondary | ICD-10-CM | POA: Diagnosis present

## 2013-10-01 DIAGNOSIS — S7223XA Displaced subtrochanteric fracture of unspecified femur, initial encounter for closed fracture: Principal | ICD-10-CM | POA: Diagnosis present

## 2013-10-01 DIAGNOSIS — Z8249 Family history of ischemic heart disease and other diseases of the circulatory system: Secondary | ICD-10-CM

## 2013-10-01 DIAGNOSIS — F0280 Dementia in other diseases classified elsewhere without behavioral disturbance: Secondary | ICD-10-CM | POA: Diagnosis present

## 2013-10-01 DIAGNOSIS — E039 Hypothyroidism, unspecified: Secondary | ICD-10-CM

## 2013-10-01 DIAGNOSIS — I10 Essential (primary) hypertension: Secondary | ICD-10-CM | POA: Diagnosis present

## 2013-10-01 DIAGNOSIS — I340 Nonrheumatic mitral (valve) insufficiency: Secondary | ICD-10-CM

## 2013-10-01 DIAGNOSIS — I059 Rheumatic mitral valve disease, unspecified: Secondary | ICD-10-CM | POA: Diagnosis present

## 2013-10-01 DIAGNOSIS — Z8673 Personal history of transient ischemic attack (TIA), and cerebral infarction without residual deficits: Secondary | ICD-10-CM

## 2013-10-01 DIAGNOSIS — E785 Hyperlipidemia, unspecified: Secondary | ICD-10-CM | POA: Diagnosis present

## 2013-10-01 DIAGNOSIS — Y92009 Unspecified place in unspecified non-institutional (private) residence as the place of occurrence of the external cause: Secondary | ICD-10-CM

## 2013-10-01 DIAGNOSIS — Z87891 Personal history of nicotine dependence: Secondary | ICD-10-CM

## 2013-10-01 DIAGNOSIS — I4891 Unspecified atrial fibrillation: Secondary | ICD-10-CM

## 2013-10-01 DIAGNOSIS — E0789 Other specified disorders of thyroid: Secondary | ICD-10-CM | POA: Diagnosis present

## 2013-10-01 DIAGNOSIS — S72001A Fracture of unspecified part of neck of right femur, initial encounter for closed fracture: Secondary | ICD-10-CM

## 2013-10-01 DIAGNOSIS — Z87440 Personal history of urinary (tract) infections: Secondary | ICD-10-CM

## 2013-10-01 DIAGNOSIS — E079 Disorder of thyroid, unspecified: Secondary | ICD-10-CM

## 2013-10-01 DIAGNOSIS — W19XXXA Unspecified fall, initial encounter: Secondary | ICD-10-CM

## 2013-10-01 HISTORY — PX: FEMUR IM NAIL: SHX1597

## 2013-10-01 LAB — CBC WITH DIFFERENTIAL/PLATELET
Basophils Absolute: 0 10*3/uL (ref 0.0–0.1)
Basophils Relative: 0 % (ref 0–1)
Eosinophils Absolute: 0 10*3/uL (ref 0.0–0.7)
Hemoglobin: 13.4 g/dL (ref 12.0–15.0)
Lymphs Abs: 1 10*3/uL (ref 0.7–4.0)
MCH: 29.8 pg (ref 26.0–34.0)
MCHC: 33.9 g/dL (ref 30.0–36.0)
MCV: 87.8 fL (ref 78.0–100.0)
Neutro Abs: 6.9 10*3/uL (ref 1.7–7.7)
Neutrophils Relative %: 82 % — ABNORMAL HIGH (ref 43–77)
Platelets: 224 10*3/uL (ref 150–400)
RBC: 4.5 MIL/uL (ref 3.87–5.11)
WBC: 8.5 10*3/uL (ref 4.0–10.5)

## 2013-10-01 LAB — TSH: TSH: 1.552 u[IU]/mL (ref 0.350–4.500)

## 2013-10-01 LAB — COMPREHENSIVE METABOLIC PANEL
ALT: 15 U/L (ref 0–35)
AST: 20 U/L (ref 0–37)
Albumin: 3.1 g/dL — ABNORMAL LOW (ref 3.5–5.2)
Alkaline Phosphatase: 65 U/L (ref 39–117)
CO2: 27 mEq/L (ref 19–32)
Chloride: 98 mEq/L (ref 96–112)
Creatinine, Ser: 0.9 mg/dL (ref 0.50–1.10)
GFR calc non Af Amer: 57 mL/min — ABNORMAL LOW (ref >90)
Potassium: 3.5 mEq/L (ref 3.5–5.1)
Sodium: 135 mEq/L (ref 135–145)
Total Bilirubin: 0.4 mg/dL (ref 0.3–1.2)
Total Protein: 6.1 g/dL (ref 6.0–8.3)

## 2013-10-01 LAB — URINALYSIS, ROUTINE W REFLEX MICROSCOPIC
Bilirubin Urine: NEGATIVE
Glucose, UA: 100 mg/dL — AB
Hgb urine dipstick: NEGATIVE
Ketones, ur: NEGATIVE mg/dL
Leukocytes, UA: NEGATIVE
Urobilinogen, UA: 0.2 mg/dL (ref 0.0–1.0)
pH: 6.5 (ref 5.0–8.0)

## 2013-10-01 LAB — TYPE AND SCREEN
ABO/RH(D): A POS
Antibody Screen: NEGATIVE

## 2013-10-01 LAB — SURGICAL PCR SCREEN
MRSA, PCR: NEGATIVE
Staphylococcus aureus: NEGATIVE

## 2013-10-01 LAB — ABO/RH: ABO/RH(D): A POS

## 2013-10-01 SURGERY — INSERTION, INTRAMEDULLARY ROD, FEMUR
Anesthesia: Spinal | Laterality: Right | Wound class: Clean

## 2013-10-01 MED ORDER — LACTATED RINGERS IV SOLN
INTRAVENOUS | Status: DC | PRN
  Administered 2013-10-01: 17:00:00 via INTRAVENOUS

## 2013-10-01 MED ORDER — MORPHINE SULFATE 4 MG/ML IJ SOLN
4.0000 mg | INTRAMUSCULAR | Status: DC | PRN
  Administered 2013-10-01: 4 mg via INTRAVENOUS
  Filled 2013-10-01: qty 1

## 2013-10-01 MED ORDER — PHENYLEPHRINE HCL 10 MG/ML IJ SOLN
INTRAMUSCULAR | Status: DC | PRN
  Administered 2013-10-01 (×2): 80 ug via INTRAVENOUS

## 2013-10-01 MED ORDER — DOCUSATE SODIUM 100 MG PO CAPS
100.0000 mg | ORAL_CAPSULE | Freq: Two times a day (BID) | ORAL | Status: DC
  Administered 2013-10-02 – 2013-10-05 (×7): 100 mg via ORAL
  Filled 2013-10-01 (×3): qty 1

## 2013-10-01 MED ORDER — OXYCODONE HCL 5 MG/5ML PO SOLN
5.0000 mg | Freq: Once | ORAL | Status: DC | PRN
  Filled 2013-10-01: qty 5

## 2013-10-01 MED ORDER — PHENYLEPHRINE HCL 10 MG/ML IJ SOLN
20.0000 mg | INTRAVENOUS | Status: DC | PRN
  Administered 2013-10-01: 50 ug/min via INTRAVENOUS

## 2013-10-01 MED ORDER — TROSPIUM CHLORIDE ER 60 MG PO CP24: 60.0000 mg | ORAL_CAPSULE | Freq: Every day | ORAL | Status: DC

## 2013-10-01 MED ORDER — KCL IN DEXTROSE-NACL 20-5-0.45 MEQ/L-%-% IV SOLN
INTRAVENOUS | Status: DC
  Administered 2013-10-02 (×2): via INTRAVENOUS
  Filled 2013-10-01 (×11): qty 1000

## 2013-10-01 MED ORDER — OXYCODONE HCL 5 MG PO TABS: 5.0000 mg | ORAL_TABLET | Freq: Once | ORAL | Status: DC | PRN

## 2013-10-01 MED ORDER — BUPIVACAINE HCL (PF) 0.5 % IJ SOLN
INTRAMUSCULAR | Status: AC
  Filled 2013-10-01: qty 30

## 2013-10-01 MED ORDER — PROMETHAZINE HCL 25 MG/ML IJ SOLN: 6.2500 mg | INTRAMUSCULAR | Status: DC | PRN

## 2013-10-01 MED ORDER — ONDANSETRON HCL 4 MG PO TABS: 4.0000 mg | ORAL_TABLET | Freq: Four times a day (QID) | ORAL | Status: DC | PRN

## 2013-10-01 MED ORDER — SODIUM CHLORIDE 0.9 % IV SOLN
INTRAVENOUS | Status: DC
  Administered 2013-10-01: via INTRAVENOUS

## 2013-10-01 MED ORDER — SODIUM CHLORIDE 0.9 % IV SOLN
INTRAVENOUS | Status: DC
  Administered 2013-10-01: 75 mL/h via INTRAVENOUS

## 2013-10-01 MED ORDER — HYDROCODONE-ACETAMINOPHEN 5-325 MG PO TABS
1.0000 | ORAL_TABLET | Freq: Four times a day (QID) | ORAL | Status: DC | PRN
  Administered 2013-10-02 – 2013-10-04 (×5): 1 via ORAL
  Administered 2013-10-04 – 2013-10-05 (×2): 2 via ORAL
  Filled 2013-10-01: qty 2
  Filled 2013-10-01 (×3): qty 1
  Filled 2013-10-01 (×2): qty 2
  Filled 2013-10-01 (×2): qty 1

## 2013-10-01 MED ORDER — MENTHOL 3 MG MT LOZG
1.0000 | LOZENGE | OROMUCOSAL | Status: DC | PRN
  Filled 2013-10-01: qty 9

## 2013-10-01 MED ORDER — MORPHINE SULFATE 2 MG/ML IJ SOLN
0.5000 mg | INTRAMUSCULAR | Status: DC | PRN
  Administered 2013-10-02 (×3): 0.5 mg via INTRAVENOUS
  Filled 2013-10-01 (×3): qty 1

## 2013-10-01 MED ORDER — DONEPEZIL HCL 10 MG PO TABS
10.0000 mg | ORAL_TABLET | Freq: Every day | ORAL | Status: DC
  Administered 2013-10-02 – 2013-10-05 (×4): 10 mg via ORAL
  Filled 2013-10-01 (×4): qty 1

## 2013-10-01 MED ORDER — ACETAMINOPHEN 650 MG RE SUPP: 650.0000 mg | Freq: Four times a day (QID) | RECTAL | Status: DC | PRN

## 2013-10-01 MED ORDER — LOSARTAN POTASSIUM 50 MG PO TABS
50.0000 mg | ORAL_TABLET | Freq: Every day | ORAL | Status: DC
  Administered 2013-10-02 – 2013-10-05 (×4): 50 mg via ORAL
  Filled 2013-10-01 (×4): qty 1

## 2013-10-01 MED ORDER — ONDANSETRON HCL 4 MG/2ML IJ SOLN: 4.0000 mg | Freq: Four times a day (QID) | INTRAMUSCULAR | Status: DC | PRN

## 2013-10-01 MED ORDER — ACETAMINOPHEN 325 MG PO TABS: 650.0000 mg | ORAL_TABLET | Freq: Four times a day (QID) | ORAL | Status: DC | PRN

## 2013-10-01 MED ORDER — LEVOTHYROXINE SODIUM 25 MCG PO TABS
12.5000 ug | ORAL_TABLET | Freq: Every day | ORAL | Status: DC
  Administered 2013-10-02 – 2013-10-05 (×4): 12.5 ug via ORAL
  Filled 2013-10-01 (×7): qty 0.5

## 2013-10-01 MED ORDER — METOCLOPRAMIDE HCL 10 MG PO TABS: 5.0000 mg | ORAL_TABLET | Freq: Three times a day (TID) | ORAL | Status: DC | PRN

## 2013-10-01 MED ORDER — MORPHINE SULFATE 4 MG/ML IJ SOLN
4.0000 mg | INTRAMUSCULAR | Status: AC | PRN
  Administered 2013-10-01 (×2): 4 mg via INTRAVENOUS
  Filled 2013-10-01 (×2): qty 1

## 2013-10-01 MED ORDER — CITALOPRAM HYDROBROMIDE 10 MG PO TABS
10.0000 mg | ORAL_TABLET | Freq: Every day | ORAL | Status: DC
  Administered 2013-10-02 – 2013-10-04 (×3): 10 mg via ORAL
  Filled 2013-10-01 (×6): qty 1

## 2013-10-01 MED ORDER — HYDROCHLOROTHIAZIDE 10 MG/ML ORAL SUSPENSION
12.5000 mg | Freq: Every day | ORAL | Status: DC
  Administered 2013-10-02 – 2013-10-05 (×4): 13 mg via ORAL
  Filled 2013-10-01 (×5): qty 1.88

## 2013-10-01 MED ORDER — PROPOFOL INFUSION 10 MG/ML OPTIME
INTRAVENOUS | Status: DC | PRN
  Administered 2013-10-01: 50 ug/kg/min via INTRAVENOUS

## 2013-10-01 MED ORDER — SENNOSIDES-DOCUSATE SODIUM 8.6-50 MG PO TABS: 1.0000 | ORAL_TABLET | Freq: Every evening | ORAL | Status: DC | PRN

## 2013-10-01 MED ORDER — MEPERIDINE HCL 50 MG/ML IJ SOLN: 6.2500 mg | INTRAMUSCULAR | Status: DC | PRN

## 2013-10-01 MED ORDER — HYDROMORPHONE HCL PF 1 MG/ML IJ SOLN
0.2500 mg | INTRAMUSCULAR | Status: DC | PRN
  Administered 2013-10-01: 0.25 mg via INTRAVENOUS

## 2013-10-01 MED ORDER — MAGNESIUM CITRATE PO SOLN: 1.0000 | Freq: Once | ORAL | Status: AC | PRN

## 2013-10-01 MED ORDER — BISACODYL 5 MG PO TBEC: 5.0000 mg | DELAYED_RELEASE_TABLET | Freq: Every day | ORAL | Status: DC | PRN

## 2013-10-01 MED ORDER — FENTANYL CITRATE 0.05 MG/ML IJ SOLN
INTRAMUSCULAR | Status: DC | PRN
  Administered 2013-10-01 (×2): 25 ug via INTRAVENOUS

## 2013-10-01 MED ORDER — PHENOL 1.4 % MT LIQD
1.0000 | OROMUCOSAL | Status: DC | PRN
  Filled 2013-10-01: qty 177

## 2013-10-01 MED ORDER — LOSARTAN POTASSIUM-HCTZ 100-12.5 MG PO TABS: 0.5000 | ORAL_TABLET | Freq: Every day | ORAL | Status: DC

## 2013-10-01 MED ORDER — METOCLOPRAMIDE HCL 5 MG/ML IJ SOLN: 5.0000 mg | Freq: Three times a day (TID) | INTRAMUSCULAR | Status: DC | PRN

## 2013-10-01 MED ORDER — HYDROMORPHONE HCL PF 1 MG/ML IJ SOLN
INTRAMUSCULAR | Status: AC
  Filled 2013-10-01: qty 1

## 2013-10-01 MED ORDER — CEFAZOLIN SODIUM-DEXTROSE 2-3 GM-% IV SOLR
INTRAVENOUS | Status: AC
  Filled 2013-10-01: qty 50

## 2013-10-01 MED ORDER — PROPOFOL 10 MG/ML IV BOLUS
INTRAVENOUS | Status: DC | PRN
  Administered 2013-10-01 (×2): 20 mg via INTRAVENOUS

## 2013-10-01 SURGICAL SUPPLY — 50 items
BAG SPEC THK2 15X12 ZIP CLS (MISCELLANEOUS) ×1
BAG ZIPLOCK 12X15 (MISCELLANEOUS) ×2 IMPLANT
BIT DRILL 4.3MMS DISTAL GRDTED (BIT) IMPLANT
BNDG COHESIVE 6X5 TAN STRL LF (GAUZE/BANDAGES/DRESSINGS) ×2 IMPLANT
CABLE (Orthopedic Implant) ×2 IMPLANT
CLOTH BEACON ORANGE TIMEOUT ST (SAFETY) ×2 IMPLANT
DRAPE C-ARM 42X120 X-RAY (DRAPES) ×2 IMPLANT
DRAPE INCISE IOBAN 66X45 STRL (DRAPES) ×2 IMPLANT
DRAPE LG THREE QUARTER DISP (DRAPES) ×2 IMPLANT
DRAPE ORTHO SPLIT 77X108 STRL (DRAPES) ×4
DRAPE STERI IOBAN 125X83 (DRAPES) ×2 IMPLANT
DRAPE SURG ORHT 6 SPLT 77X108 (DRAPES) ×2 IMPLANT
DRAPE U-SHAPE 47X51 STRL (DRAPES) ×2 IMPLANT
DRILL 4.3MMS DISTAL GRADUATED (BIT) ×2
DRSG AQUACEL AG ADV 3.5X14 (GAUZE/BANDAGES/DRESSINGS) ×1 IMPLANT
DRSG PAD ABDOMINAL 8X10 ST (GAUZE/BANDAGES/DRESSINGS) ×2 IMPLANT
DURAPREP 26ML APPLICATOR (WOUND CARE) ×2 IMPLANT
ELECT REM PT RETURN 9FT ADLT (ELECTROSURGICAL) ×2
ELECTRODE REM PT RTRN 9FT ADLT (ELECTROSURGICAL) ×1 IMPLANT
FACESHIELD LNG OPTICON STERILE (SAFETY) IMPLANT
GLOVE BIO SURGEON STRL SZ7 (GLOVE) ×2 IMPLANT
GLOVE BIO SURGEON STRL SZ7.5 (GLOVE) ×2 IMPLANT
GLOVE BIO SURGEON STRL SZ8 (GLOVE) ×2 IMPLANT
GLOVE BIOGEL PI IND STRL 7.0 (GLOVE) ×1 IMPLANT
GLOVE BIOGEL PI IND STRL 8 (GLOVE) ×1 IMPLANT
GLOVE BIOGEL PI INDICATOR 7.0 (GLOVE) ×1
GLOVE BIOGEL PI INDICATOR 8 (GLOVE) ×1
GUIDEPIN 3.2X17.5 THRD DISP (PIN) ×1 IMPLANT
GUIDEWIRE BALL NOSE 80CM (WIRE) ×1 IMPLANT
HIP FRAC NAIL LAG SCR 10.5X100 (Orthopedic Implant) ×1 IMPLANT
KIT BASIN OR (CUSTOM PROCEDURE TRAY) ×2 IMPLANT
NAIL AFFIXUS RT 11X340MM (Nail) ×1 IMPLANT
PACK GENERAL/GYN (CUSTOM PROCEDURE TRAY) ×2 IMPLANT
PACK LOWER EXTREMITY WL (CUSTOM PROCEDURE TRAY) ×2 IMPLANT
PAD CAST 4YDX4 CTTN HI CHSV (CAST SUPPLIES) ×1 IMPLANT
PADDING CAST COTTON 4X4 STRL (CAST SUPPLIES) ×2
POSITIONER SURGICAL ARM (MISCELLANEOUS) ×2 IMPLANT
SCREW BONE CORTICAL 5.0X38 (Screw) ×1 IMPLANT
SCREW CANN THRD AFF 10.5X100 (Orthopedic Implant) IMPLANT
SPONGE GAUZE 4X4 12PLY (GAUZE/BANDAGES/DRESSINGS) ×2 IMPLANT
STAPLER VISISTAT (STAPLE) ×2 IMPLANT
STAPLER VISISTAT 35W (STAPLE) ×1 IMPLANT
STOCKINETTE 8 INCH (MISCELLANEOUS) ×2 IMPLANT
SUT VIC AB 1 CT1 27 (SUTURE) ×6
SUT VIC AB 1 CT1 27XBRD ANTBC (SUTURE) IMPLANT
SUT VIC AB 2-0 CT1 27 (SUTURE) ×6
SUT VIC AB 2-0 CT1 27XBRD (SUTURE) IMPLANT
SUT VIC AB 2-0 CT1 TAPERPNT 27 (SUTURE) IMPLANT
TOWEL OR 17X26 10 PK STRL BLUE (TOWEL DISPOSABLE) ×2 IMPLANT
TRAY FOLEY CATH 14FRSI W/METER (CATHETERS) ×2 IMPLANT

## 2013-10-01 NOTE — Transfer of Care (Signed)
Immediate Anesthesia Transfer of Care Note  Patient: Dawn Bradshaw  Procedure(s) Performed: Procedure(s): INTRAMEDULLARY (IM) NAIL FEMORAL (Right)  Patient Location: PACU  Anesthesia Type:Regional and Spinal  Level of Consciousness: awake and alert   Airway & Oxygen Therapy: Patient Spontanous Breathing and Patient connected to face mask oxygen  Post-op Assessment: Report given to PACU RN and Post -op Vital signs reviewed and stable  Post vital signs: Reviewed and stable  Complications: No apparent anesthesia complications

## 2013-10-01 NOTE — ED Notes (Signed)
Attempted EKG but patient in Xray.

## 2013-10-01 NOTE — Op Note (Signed)
DATE OF PROCEDURE: 07/02/2012  PREOPERATIVE DIAGNOSIS: R hip subtrochanteric fracture that did spiral down the diaphysis for 15 cm becoming a diaphyseal spiral fracture.  POSTOPERATIVE DIAGNOSIS: Same  PROCEDURE: Open reduction internal fixation right hip into diaphyseal spiral fracture using a Biomet affixus trochanteric nail 11 mm x 34 cm, with cerclage wires in the diaphyseal region to reduce the fracture and one distal locking screw. Proximally we used a 100 mm bolt.  SURGEON: Gean Birchwood J  ASSISTANT: Tomi Likens. Gaylene Brooks  (present throughout entire procedure and necessary for timely completion of the procedure) ANESTHESIA: General  BLOOD LOSS: 400 cc  FLUID REPLACEMENT: 1200 cc crystalloid  DRAINS: Foley Catheter  URINE OUTPUT: 300cc  COMPLICATIONS: none   INDICATIONS FOR PROCEDURE: Patient lives at home with her son who has power of attorney she has moderate dementia she fell off of her walker today and sustained a right hip and femur fracture. Patient presented to the emergency room, was admitted by the medicine service and orthopedic consultation was obtained. To decrease pain and increase function we have recommended open reduction internal fixation using a trochanteric nail with possible cerclage wires because of the anterior angular displacement of the proximal femur. The risks, benefits, and alternatives were discussed at length including but not limited to the risks of infection, bleeding, nerve injury, stiffness, blood clots, the need for revision surgery, cardiopulmonary complications, among others, and they were willing to proceed. Benefits have been discussed. Questions answered.   PROCEDURE IN DETAIL: The patient was identified by armband, received preoperative IV antibiotics in the holding area, taken to the operating room , appropriate anesthetic monitors were attached and general endotracheal anesthesia induced. Pt. was then transferred to a radiolucent flat Jackson table, rolled  into the left lateral decubitus position and fixed there with a Stulberg Mark 2 pelvic clamp. Under C-arm imaging control we then performed a closed reduction with abduction and internal rotation. The distal fragment remained a posterior and medial.The right lower extremity then prepped and draped in usual sterile fashion in the iliac crest to the ankle. A timeout procedure was performed. We began the procedure by making a small incision 8 cm proximal to the greater trochanter allowing introduction of the guide pin into the tip of the greater trochanter or was drilled into the metaphyseal region and then overreamed with the proximal reamer. A ball-tipped guidewire was then placed inside of the joystick which was then maneuvered into the proximal fragment and we attempted close reduction the required both traction as well as forced from the posterior aspect of the distal femoral fragment to get the guidewire down. We then gently reamed up to 12.5 mm and measured for a 34 cm 11 mm nail that was introduced over the guidewire. With a nail in place the anterior fragment was still anteriorly angulated more than 30 and grossly displaced along the spiral. At that point we elected to perform open reduction through a lateral incision over the fracture site 10 cm in length. We cut through the skin and subcutaneous tissue down to the IT band which was cut in line with the skin incision we then split the fibers of the vastus lateralis and identified of the long spiral fracture site. The rod was partially removed with traction and we were able to directly reduce the long spiral fracture and reintroduced the nail. We then fixed this with 21.8 mm Zimmer cerclage wires with 100 pounds tension. With a nail in place there is no motion at the fracture  site and the leg was out to length. The nail was then inserted to the appropriate depth to allow insertion of a 100 mm locking bolt, that was placed under C-arm control over a guidewire.  The tip of the bulb was within 1 cm of the subchondral bone slightly inferior and dead center on the lateral view. We then elected to place a distal locking screw and a slot and did this using the perfect circle technique for a 1.5 cm skin incision. The distal screw is a 38 mm x 4.5 mm titanium locking screw. C-arm images were taken confirming a near-anatomic reduction. The wounds were thoroughly irrigated with normal saline solution. The vastus lateralis was closed with running 0 Vicryl suture, the IT band with running #1 Vicryl suture, the subcutaneous tissue with 0 and 2-0 undyed Vicryl suture and the skin with staples. Staples were also placed in the distal locking screw wound and the proximal wound was closed with 2-0 Vicryl suture and staples. A dressing of Mepilex was then applied the patient was unclamped rolled supine awakened extubated and taken to the recovery room without difficulty.  Gean Birchwood J  07/02/2012, 7:29 PM

## 2013-10-01 NOTE — ED Provider Notes (Signed)
TIME SEEN: 10:40 AM  CHIEF COMPLAINT: Fall, syncope  HPI: Patient is an 79 year old female with a history of Alzheimer's dementia, hypertension, hyperlipidemia, hypothyroidism, diabetes, atrial fibrillation on Pradaxa, prior stroke who presents to the emergency department with a fall today. Given patient's dementia, she is unable to give me any information about the fall except that she is having pain over her right hip. Patient's son who the patient lives with gives most of the history. He states that she was recently admitted to [facility] in [facility] for multiple episodes of becoming unresponsive. He states some of these episode she is unconscious but others she is staring off into space and does not answer questions. He states that she was found to have urinary tract infection and has been on antibiotics. She began having these episodes again yesterday. He reports that this morning, his mother ate breakfast and then when getting out of the chair fell onto her right side. He did not see what caused her to fall. He is not sure if she hit her head. She was conscious and alert and complaining of right hip pain. She is been unable to walk since her fall.  PCP is Ivery Quale with [facility]  ROS: Unobtainable secondary to patient's dementia  PAST MEDICAL HISTORY/PAST SURGICAL HISTORY:  Past Medical History  Diagnosis Date  . Hyperlipidemia   . Thyroid disease     hypothyroidism  . Psoriasis   . Osteopenia   . LBP (low back pain)     mild  . Palpitations     H/O  . Diabetes mellitus     Type II  . Alzheimer's disease     mild  . Arrhythmia 4/12    A-Fib @ [facility]  . Atrial fibrillation   . Stroke   . Hypertension     MEDICATIONS:  Prior to Admission medications   Medication Sig Start Date End Date Taking? Authorizing Provider  Calcium-Vitamin D (CALTRATE 600 PLUS-VIT D PO) Take 600 mg by mouth daily.    Yes Historical Provider, MD  dabigatran (PRADAXA) 150 MG CAPS  Take 150 mg by mouth daily.    Yes Historical Provider, MD  donepezil (ARICEPT) 10 MG tablet Take 10 mg by mouth daily.    Yes Historical Provider, MD  levothyroxine (SYNTHROID) 25 MCG tablet Take 12.5 mcg by mouth daily.    Yes Historical Provider, MD  losartan-hydrochlorothiazide (HYZAAR) 100-12.5 MG per tablet Take 0.5 tablets by mouth daily.   Yes Historical Provider, MD  Trospium Chloride (SANCTURA XR) 60 MG CP24 Take 60 mg by mouth daily.    Yes Historical Provider, MD    ALLERGIES:  No Known Allergies  SOCIAL HISTORY:  History  Substance Use Topics  . Smoking status: Former Smoker    Types: Cigarettes    Quit date: 11/25/1948  . Smokeless tobacco: Never Used  . Alcohol Use: No    FAMILY HISTORY: Family History  Problem Relation Age of Onset  . Heart disease Brother     EXAM: BP 162/66  Pulse 70  Temp(Src) 97.6 F (36.4 C) (Oral)  Resp 16  SpO2 94% CONSTITUTIONAL: Alert and oriented to person and place and responds appropriately to questions. Well-appearing; well-nourished; GCS 15 HEAD: Normocephalic; atraumatic EYES: Conjunctivae clear, PERRL, EOMI ENT: normal nose; no rhinorrhea; moist mucous membranes; pharynx without lesions noted; no dental injury; no septal hematoma NECK: Supple, no meningismus, no LAD; no midline spinal tenderness, step-off or deformity CARD: RRR; S1 and S2 appreciated; no  murmurs, no clicks, no rubs, no gallops RESP: Normal chest excursion without splinting or tachypnea; breath sounds clear and equal bilaterally; no wheezes, no rhonchi, no rales; chest wall stable, nontender to palpation ABD/GI: Normal bowel sounds; non-distended; soft, non-tender, no rebound, no guarding PELVIS:  stable, tender to palpation over the right hip diffusely with pain with flexion and internal and external rotation, no leg length discrepancy BACK:  The back appears normal and is non-tender to palpation, there is no CVA tenderness; no midline spinal tenderness,  step-off or deformity EXT: Patient has tenderness to palpation of her right hip diffusely with limited range of motion secondary to pain, no pain over the right knee or right ankle, full range of motion in the right knee, right ankle, right toes, sensation to light touch intact diffusely, 2+ DP pulses bilaterally, otherwise Normal ROM in all joints; non-tender to palpation; no edema; normal capillary refill; no cyanosis    SKIN: Normal color for age and race; warm NEURO: Moves all extremities equally; cranial nerves II through XII intact, sensation to light touch intact diffusely PSYCH: The patient's mood and manner are appropriate. Grooming and personal hygiene are appropriate.  MEDICAL DECISION MAKING: Patient with a fall today at home. She has a right comminuted and displaced oblique fracture to the proximal right femur extending to the lesser trochanter. Concern for syncopal events over the past several days. Unclear cause a fall today. We'll obtain labs, urine, troponin and EKG, chest x-ray. We'll also CT her head, cervical spine given unclear details of fall and patient is on Primaxin with distracting injury. Patient will need admission.  ED PROGRESS: Outside hospital records from [facility] show admission on 09/12/13 and discharged on 09/15/13. Pt had a carotid Doppler which showed less than 50% stenosis of both right and left internal carotid arteries. EEG showed rare right temporal slowing that may indicate a relatively inactive seizure focus with no electrographic seizures seen on EEG. Neurology was consulted, Dr. Adella Hare, and they did not recommend starting her on antiepileptics. Serial troponins were negative.  12:12 PM Spoke with Dr. Turner Daniels with orthopedics who will see in ED.  Plans to take patient to the OR at 4 PM. Head and cervical spine CT show no acute changes.  12:46 PM  Labs unremarkable.  Urine shows no sign of infection. Will d/w [facility] for admission.  1:16 PM  Spoke with  Dr. Eloise Harman who reports hip fractures are admitted to hospitalist service.  D/w hospitalist for admission.     EKG Interpretation     Ventricular Rate:  69 PR Interval:    QRS Duration: 100 QT Interval:  420 QTC Calculation: 450 R Axis:   47 Text Interpretation:  Atrial flutter Nonspecfic IVCD             Layla Maw Keidan Aumiller, DO 10/01/13 1317

## 2013-10-01 NOTE — Consult Note (Signed)
Reason for Consult: Right proximal femur long spiral fracture going from the subtrochanteric region into the diaphysis displaced Referring Physician: Rochele Raring D.O.  ISSABELLE Bradshaw Bradshaw an 79 y.o. female.  HPI: 79 year old female, moderate dementia, household ambulator who lives with her son who has power of attorney. Patient fell off of her walker this morning and sustained a right proximal femur fracture extending from the subtrochanteric region down into the diaphysis along a spiral about 12 cm in length. The fracture shortened a couple of centimeters and displaced a couple of centimeters. The patient complains of moderate pain that has been controlled with morphine here in the emergency room. She denies any head injury and has had a CT scan showing no acute head injury but significant cerebral atrophy. Her son reports that she's had some falling episodes recently and was in [facility] with a UTI a few weeks ago. She Bradshaw normally a patient of [facility] who will be admitting her and caring for her medical needs. Of interest she had a left hip intertrochanteric fracture in 2003 that was fixed by one of my partners Dr. Jerl Santos, with a sliding hip screw and sideplate. She did well after the surgery. She denies any other injuries. She also has a history of atrial fibrillation on pradaxa. She last ate between 8 and 9 this morning  Past Medical History  Diagnosis Date  . Hyperlipidemia   . Thyroid disease     hypothyroidism  . Psoriasis   . Osteopenia   . LBP (low back pain)     mild  . Palpitations     H/O  . Diabetes mellitus     Type II  . Alzheimer's disease     mild  . Arrhythmia 4/12    A-Fib @ [facility]  . Atrial fibrillation   . Stroke   . Hypertension     Past Surgical History  Procedure Laterality Date  . Cleft palate repair    . Myomectomy  1980  . Colonoscopy  1999  . Orif hip fracture  08/2002  . Cataract extraction  08/2008    Family History   Problem Relation Age of Onset  . Heart disease Brother     Social History:  reports that she quit smoking about 64 years ago. Her smoking use included Cigarettes. She smoked 0.00 packs per day. She has never used smokeless tobacco. She reports that she does not drink alcohol or use illicit drugs.  Allergies: No Known Allergies  Medications: I have reviewed the patient's current medications.  Results for orders placed during the hospital encounter of 10/01/13 (from the past 48 hour(s))  CBC WITH DIFFERENTIAL     Status: Abnormal   Collection Time    10/01/13 11:59 AM      Result Value Range   WBC 8.5  4.0 - 10.5 K/uL   RBC 4.50  3.87 - 5.11 MIL/uL   Hemoglobin 13.4  12.0 - 15.0 g/dL   HCT 16.1  09.6 - 04.5 %   MCV 87.8  78.0 - 100.0 fL   MCH 29.8  26.0 - 34.0 pg   MCHC 33.9  30.0 - 36.0 g/dL   RDW 40.9  81.1 - 91.4 %   Platelets 224  150 - 400 K/uL   Neutrophils Relative % 82 (*) 43 - 77 %   Neutro Abs 6.9  1.7 - 7.7 K/uL   Lymphocytes Relative 11 (*) 12 - 46 %   Lymphs Abs 1.0  0.7 -  4.0 K/uL   Monocytes Relative 7  3 - 12 %   Monocytes Absolute 0.6  0.1 - 1.0 K/uL   Eosinophils Relative 0  0 - 5 %   Eosinophils Absolute 0.0  0.0 - 0.7 K/uL   Basophils Relative 0  0 - 1 %   Basophils Absolute 0.0  0.0 - 0.1 K/uL  COMPREHENSIVE METABOLIC PANEL     Status: Abnormal   Collection Time    10/01/13 11:59 AM      Result Value Range   Sodium 135  135 - 145 mEq/L   Potassium 3.5  3.5 - 5.1 mEq/L   Chloride 98  96 - 112 mEq/L   CO2 27  19 - 32 mEq/L   Glucose, Bld 219 (*) 70 - 99 mg/dL   BUN 17  6 - 23 mg/dL   Creatinine, Ser 1.61  0.50 - 1.10 mg/dL   Calcium 9.5  8.4 - 09.6 mg/dL   Total Protein 6.1  6.0 - 8.3 g/dL   Albumin 3.1 (*) 3.5 - 5.2 g/dL   AST 20  0 - 37 U/L   ALT 15  0 - 35 U/L   Alkaline Phosphatase 65  39 - 117 U/L   Total Bilirubin 0.4  0.3 - 1.2 mg/dL   GFR calc non Af Amer 57 (*) >90 mL/min   GFR calc Af Amer 66 (*) >90 mL/min   Comment: (NOTE)     The  eGFR has been calculated using the CKD EPI equation.     This calculation has not been validated in all clinical situations.     eGFR's persistently <90 mL/min signify possible Chronic Kidney     Disease.  PROTIME-INR     Status: None   Collection Time    10/01/13 11:59 AM      Result Value Range   Prothrombin Time 14.2  11.6 - 15.2 seconds   INR 1.12  0.00 - 1.49  TROPONIN I     Status: None   Collection Time    10/01/13 11:59 AM      Result Value Range   Troponin I <0.30  <0.30 ng/mL   Comment:            Due to the release kinetics of cTnI,     a negative result within the first hours     of the onset of symptoms does not rule out     myocardial infarction with certainty.     If myocardial infarction Bradshaw still suspected,     repeat the test at appropriate intervals.  TYPE AND SCREEN     Status: None   Collection Time    10/01/13 12:05 PM      Result Value Range   ABO/RH(D) A POS     Antibody Screen NEG     Sample Expiration 10/04/2013    ABO/RH     Status: None   Collection Time    10/01/13 12:05 PM      Result Value Range   ABO/RH(D) A POS    URINALYSIS, ROUTINE W REFLEX MICROSCOPIC     Status: Abnormal   Collection Time    10/01/13 12:17 PM      Result Value Range   Color, Urine YELLOW  YELLOW   APPearance CLEAR  CLEAR   Specific Gravity, Urine 1.020  1.005 - 1.030   pH 6.5  5.0 - 8.0   Glucose, UA 100 (*) NEGATIVE mg/dL   Hgb urine dipstick  NEGATIVE  NEGATIVE   Bilirubin Urine NEGATIVE  NEGATIVE   Ketones, ur NEGATIVE  NEGATIVE mg/dL   Protein, ur NEGATIVE  NEGATIVE mg/dL   Urobilinogen, UA 0.2  0.0 - 1.0 mg/dL   Nitrite NEGATIVE  NEGATIVE   Leukocytes, UA NEGATIVE  NEGATIVE   Comment: MICROSCOPIC NOT DONE ON URINES WITH NEGATIVE PROTEIN, BLOOD, LEUKOCYTES, NITRITE, OR GLUCOSE <1000 mg/dL.    Dg Chest 1 View  10/01/2013   CLINICAL DATA:  Preop for right femur fracture fixation.  EXAM: CHEST - 1 VIEW  COMPARISON:  09/10/2013  FINDINGS: Cardiac silhouette Bradshaw  mildly enlarged. The aorta Bradshaw mildly uncoiled and tortuous. No mediastinal or hilar masses are appreciated.  There Bradshaw vascular congestion without overt pulmonary edema. This Bradshaw accentuated by relatively low lung volumes. No focal consolidation. No pleural effusion or pneumothorax. Minor left lung base atelectasis Bradshaw stable from the prior exam.  The bony thorax Bradshaw demineralized but grossly intact.  IMPRESSION: No acute cardiopulmonary disease.   Electronically Signed   By: Amie Portland M.D.   On: 10/01/2013 11:17   Dg Hip Complete Right  10/01/2013   CLINICAL DATA:  Fall with right hip and leg pain.  EXAM: RIGHT HIP - COMPLETE 2+ VIEW  COMPARISON:  01/19/2008.  FINDINGS: There Bradshaw a comminuted oblique fracture of the proximal right femoral diaphysis which involves the lesser trochanter. The lesser trochanteric fragment Bradshaw slightly medially displaced. There Bradshaw 2.2 cm of posterior displacement of the major distal fracture fragment displacement. There Bradshaw 17 mm of medial displacement of the distal fracture fragment. There Bradshaw no hip fracture or dislocation.  There Bradshaw orthopedic hardware transfixing a old healed left proximal femoral fracture. There Bradshaw generalized osteopenia. There are mild degenerative changes of bilateral SI joints. There are degenerative changes of the visualized lower lumbar spine.  IMPRESSION: Comminuted and displaced oblique fracture of the proximal right femoral diaphysis extending into the lesser trochanter.   Electronically Signed   By: Elige Ko   On: 10/01/2013 11:11   Dg Femur Right  10/01/2013   CLINICAL DATA:  Fall this morning. Severe right hip and leg pain.  EXAM: RIGHT FEMUR - 2 VIEW  COMPARISON:  Right hip x-ray of the same date.  FINDINGS: Comminuted oblique fracture of the proximal right femoral diaphysis Bradshaw noted. This involves the lesser trochanter. Lesser trochanter fragment Bradshaw displaced medially. There Bradshaw posterior and superior displacement of the distal fracture fragment.  Posterior displacement Bradshaw approximately 2.5 cm. Superior displacement Bradshaw approximately 13 cm.  IMPRESSION: Comminuted displaced oblique fracture of the proximal right femoral diaphysis. This involves the lesser trochanter.   Electronically Signed   By: Jerene Dilling M.D.   On: 10/01/2013 11:18   Ct Head Wo Contrast  10/01/2013   CLINICAL DATA:  Post fall this morning, no loss of consciousness are head injury  EXAM: CT HEAD WITHOUT CONTRAST  CT CERVICAL SPINE WITHOUT CONTRAST  TECHNIQUE: Multidetector CT imaging of the head and cervical spine was performed following the standard protocol without intravenous contrast. Multiplanar CT image reconstructions of the cervical spine were also generated.  COMPARISON:  Head CT -09/10/2013; 02/20/2012  FINDINGS: CT HEAD FINDINGS  Redemonstrated advanced atrophy with sulcal prominence and centralized volume loss with commensurate mild ex vacuo dilatation of the ventricular system. Unchanged lacunar infarcts within the periventricular white matter about the anterior horn of the right lateral ventricle and within the left cerebellum (image 9, series 4). Rather extensive periventricular hypodensities are grossly unchanged compatible  with microvascular ischemic disease. Given back from parenchymal abnormalities, there Bradshaw no CT evidence for acute large territory infarct. No intraparenchymal extra-axial mass or hemorrhage. Unchanged size and configuration of the ventricles and basilar cisterns. No midline shift. Intracranial atherosclerosis. Re demonstrated under pneumatization of the bilateral mastoid air cells. Remaining paranasal sinuses are normally aerated. Regional soft tissues are normal. Mild hyperostosis frontalis. No displaced calvarial fracture. Post bilateral cataract surgery.  CT CERVICAL SPINE FINDINGS  C1 to the inferior endplate of T2 Bradshaw imaged.  Mild scoliotic curvature of the cervical spine, convex to the left, possibly positional. No anterolisthesis or  retrolisthesis. The bilateral facets are normally aligned. The dens Bradshaw normally positioned between the lateral masses of C1. Mild degenerative change of the atlantodental articulation. Normal atlantoaxial articulations.  No fracture or static subluxation of the cervical spine. Cervical vertebral body heights are preserved. Prevertebral soft tissues are normal.  There Bradshaw mild to moderate multilevel cervical spine DDD, likely worse at C5-C6 and C6-C7 with disk space height loss, endplate irregularity and small posteriorly directed disk osteophyte complexes at these levels.  Calcifications within the bilateral carotid bulb. There Bradshaw an exophytic at least 5.9 x 4.8 cm mixed attenuating mass which appears to arise from the caudal aspect of the right lobe of the thyroid it results in mild leftward deviation of the tracheal air column (image 72, series 5) - note, though the caudal extent of this mass Bradshaw not imaged on this examination. There Bradshaw an additional approximately 1.7 x 1.5 cm nodule within the caudal aspect of the left lobe of the thyroid. No bulky cervical lymphadenopathy on this noncontrast examination.  Limited visualization of lung apices Bradshaw normal.  IMPRESSION: 1. No acute intracranial process. 2. Stable findings of advanced atrophy and microvascular ischemic disease. 3. No fracture or static subluxation of the cervical spine. 4. Mild to moderate multilevel cervical spine DDD. 5. Exophytic at least 5.9 cm mass appears to arise from the caudal aspect of the right lobe of the thyroid. There Bradshaw an additional approximately 1.7 cm nodule within the caudal aspect of the left lobe of the thyroid. Further evaluation with nonemergent thyroid ultrasound may be performed as clinically indicated. 6. Calcifications within the bilateral carotid bulbs. Further evaluation with nonemergent carotid Doppler ultrasound may be performed as clinically indicated.   Electronically Signed   By: Simonne Come M.D.   On: 10/01/2013 12:05    Ct Cervical Spine Wo Contrast  10/01/2013   CLINICAL DATA:  Post fall this morning, no loss of consciousness are head injury  EXAM: CT HEAD WITHOUT CONTRAST  CT CERVICAL SPINE WITHOUT CONTRAST  TECHNIQUE: Multidetector CT imaging of the head and cervical spine was performed following the standard protocol without intravenous contrast. Multiplanar CT image reconstructions of the cervical spine were also generated.  COMPARISON:  Head CT -09/10/2013; 02/20/2012  FINDINGS: CT HEAD FINDINGS  Redemonstrated advanced atrophy with sulcal prominence and centralized volume loss with commensurate mild ex vacuo dilatation of the ventricular system. Unchanged lacunar infarcts within the periventricular white matter about the anterior horn of the right lateral ventricle and within the left cerebellum (image 9, series 4). Rather extensive periventricular hypodensities are grossly unchanged compatible with microvascular ischemic disease. Given back from parenchymal abnormalities, there Bradshaw no CT evidence for acute large territory infarct. No intraparenchymal extra-axial mass or hemorrhage. Unchanged size and configuration of the ventricles and basilar cisterns. No midline shift. Intracranial atherosclerosis. Re demonstrated under pneumatization of the bilateral mastoid air cells. Remaining paranasal  sinuses are normally aerated. Regional soft tissues are normal. Mild hyperostosis frontalis. No displaced calvarial fracture. Post bilateral cataract surgery.  CT CERVICAL SPINE FINDINGS  C1 to the inferior endplate of T2 Bradshaw imaged.  Mild scoliotic curvature of the cervical spine, convex to the left, possibly positional. No anterolisthesis or retrolisthesis. The bilateral facets are normally aligned. The dens Bradshaw normally positioned between the lateral masses of C1. Mild degenerative change of the atlantodental articulation. Normal atlantoaxial articulations.  No fracture or static subluxation of the cervical spine. Cervical  vertebral body heights are preserved. Prevertebral soft tissues are normal.  There Bradshaw mild to moderate multilevel cervical spine DDD, likely worse at C5-C6 and C6-C7 with disk space height loss, endplate irregularity and small posteriorly directed disk osteophyte complexes at these levels.  Calcifications within the bilateral carotid bulb. There Bradshaw an exophytic at least 5.9 x 4.8 cm mixed attenuating mass which appears to arise from the caudal aspect of the right lobe of the thyroid it results in mild leftward deviation of the tracheal air column (image 72, series 5) - note, though the caudal extent of this mass Bradshaw not imaged on this examination. There Bradshaw an additional approximately 1.7 x 1.5 cm nodule within the caudal aspect of the left lobe of the thyroid. No bulky cervical lymphadenopathy on this noncontrast examination.  Limited visualization of lung apices Bradshaw normal.  IMPRESSION: 1. No acute intracranial process. 2. Stable findings of advanced atrophy and microvascular ischemic disease. 3. No fracture or static subluxation of the cervical spine. 4. Mild to moderate multilevel cervical spine DDD. 5. Exophytic at least 5.9 cm mass appears to arise from the caudal aspect of the right lobe of the thyroid. There Bradshaw an additional approximately 1.7 cm nodule within the caudal aspect of the left lobe of the thyroid. Further evaluation with nonemergent thyroid ultrasound may be performed as clinically indicated. 6. Calcifications within the bilateral carotid bulbs. Further evaluation with nonemergent carotid Doppler ultrasound may be performed as clinically indicated.   Electronically Signed   By: Simonne Come M.D.   On: 10/01/2013 12:05    ROS patient denies any chest pain or shortness of breath denies any nausea or vomiting the. Main complaint Bradshaw pain and spasm in the right lower trembly. Blood pressure 162/66, pulse 70, temperature 97.6 F (36.4 C), temperature source Oral, resp. rate 16, SpO2 94.00%. Physical  Exam patient's right lower extremity Bradshaw shortened 1 inch and externally rotated there Bradshaw swelling to the proximal thigh 1+ but the skin Bradshaw soft and not tense. She moves her foot up and down without difficulty has normal sensation of her foot and normal pulses. The left lower [place] has a full range of motion.  Assessment/Plan: Displaced long spiral right proximal femur subtrochanteric fracture that goes into the diaphysis over a 10-12 cm course. The patient's condition was discussed with her son Bradshaw power of attorney and we will proceed with open reduction internal fixation using a locked trochanteric nail stabilizes reamer fracture, decrease her pain, and increase her mobility. She'll probably need skilled nursing placement for at least a month after the surgery. Regarding DVT prophylaxis continuing the Pradaxa Bradshaw probably reasonable. Postoperatively we will try to use either oxycodone or hydrocodone for discomfort for the first few days and get her off pain medicines as soon as possible because of her dementia. Surgery Bradshaw scheduled for later this afternoon for  [facility] 10/01/2013, 3:12 PM

## 2013-10-01 NOTE — Anesthesia Preprocedure Evaluation (Addendum)
Anesthesia Evaluation  Patient identified by MRN, date of birth, ID band Patient awake    Reviewed: Allergy & Precautions, H&P , NPO status , Patient's Chart, lab work & pertinent test results  Airway Mallampati: II TM Distance: >3 FB Neck ROM: Full    Dental  (+) Dental Advisory Given and Poor Dentition   Pulmonary neg pulmonary ROS,  breath sounds clear to auscultation        Cardiovascular hypertension, Pt. on medications + dysrhythmias Atrial Fibrillation Rhythm:Regular Rate:Normal     Neuro/Psych PSYCHIATRIC DISORDERS CVA    GI/Hepatic negative GI ROS, Neg liver ROS,   Endo/Other  diabetes, Type 2Hypothyroidism   Renal/GU negative Renal ROS     Musculoskeletal negative musculoskeletal ROS (+)   Abdominal   Peds  Hematology negative hematology ROS (+)   Anesthesia Other Findings   Reproductive/Obstetrics negative OB ROS                         Anesthesia Physical Anesthesia Plan  ASA: III  Anesthesia Plan: Spinal   Post-op Pain Management:    Induction:   Airway Management Planned:   Additional Equipment:   Intra-op Plan:   Post-operative Plan:   Informed Consent: I have reviewed the patients History and Physical, chart, labs and discussed the procedure including the risks, benefits and alternatives for the proposed anesthesia with the patient or authorized representative who has indicated his/her understanding and acceptance.   Dental advisory given  Plan Discussed with: CRNA  Anesthesia Plan Comments: (Given her dementia, she was unable to tell me if she had taken anticoagulants. By report the patient had not taken her pradaxa. Given her normal coagulation studies I felt it safe to proceed with spinal. I did not see until after that it was charted that she received pradaxa today. Will wait until full resolution of her block prior to D/C from PACU. I discussed this with Dr.  Turner Daniels and I also called the son of the patient, but he did not answer. )      Anesthesia Quick Evaluation

## 2013-10-01 NOTE — ED Notes (Signed)
Hospitalist at bedside 

## 2013-10-01 NOTE — Preoperative (Signed)
Beta Blockers   Reason not to administer Beta Blockers:Not Applicable 

## 2013-10-01 NOTE — ED Notes (Signed)
Turner Daniels MD at bedside.

## 2013-10-01 NOTE — Progress Notes (Signed)
[facility] ED CM Spoke with pt and family (son, Arlys John and Burna Mortimer, daughter in law about CM consult for wheelchair. CM spoke with them about CM consult Pt has a walker at home but "rarely uses it" Son states this is why pt fell related to non compliance CM discuss with them that [name] reported having difficulty reaching pt after 02/09/13 [facility] ED visit Cm reviewed contact information with them and updated EPIC. Pt does not have a number but CM entered [place] and Wanda's numbers.  Discussed w/c will be processed closer to d/c and her plans to return to [facility] will also be addressed closer to d/c    Pt and family states that the staff at [facility] in [place] reports pt can return to the facility after her hospitalization

## 2013-10-01 NOTE — Anesthesia Postprocedure Evaluation (Signed)
  Anesthesia Post-op Note  Patient: Dawn Bradshaw  Procedure(s) Performed: Procedure(s): INTRAMEDULLARY (IM) NAIL FEMORAL (Right)  Patient Location: PACU  Anesthesia Type:Spinal  Level of Consciousness: awake, alert  and patient cooperative  Airway and Oxygen Therapy: Patient Spontanous Breathing  Post-op Pain: none  Post-op Assessment: Post-op Vital signs reviewed, Patient's Cardiovascular Status Stable, Respiratory Function Stable, Patent Airway, No signs of Nausea or vomiting, Adequate PO intake and Pain level controlled  Post-op Vital Signs: Reviewed and stable  Complications: No apparent anesthesia complications

## 2013-10-01 NOTE — ED Notes (Signed)
Bed: ZO10 Expected date:  Expected time:  Means of arrival:  Comments: ems-fall -hip pain

## 2013-10-01 NOTE — Anesthesia Procedure Notes (Signed)

## 2013-10-01 NOTE — Progress Notes (Addendum)
Choices provided:  [name] branches close to [place] [address] [place] [address] [place]  [phone] [address] [place], [place] [address]  [place] [address] [phone] [address] [place], [place] [address]  Family choice is [place] branch  1505 CM spoke with Larene Beach in the [place] office to call in order Cm faxing facesheet, EDP note, labs and order to [phone] per Vickie's request for this [employer] medicare pt Further clinicals (H&P and op report needs to be faxed when available to [facility]) Fax confirmation received at 1623 on 10/01/13

## 2013-10-01 NOTE — ED Notes (Addendum)
RN Lyla Son requesting to give pain medication prior to inserting foley catheter.

## 2013-10-01 NOTE — ED Notes (Signed)
Case Manager Kim at bedside 

## 2013-10-01 NOTE — ED Notes (Signed)
Dawn Bradshaw, pt's son cell phone number where he can be reached at [phone]

## 2013-10-01 NOTE — ED Notes (Signed)
Attempted IV access x 3, all unsucessful.

## 2013-10-01 NOTE — ED Notes (Addendum)
Pt had fall this am. States legs gave out. Denies LOC or head injury. Complains of R hip pain and swelling. No deformity noted. Pt d/c from hospital a week ago for uti.

## 2013-10-01 NOTE — H&P (Signed)
[facility]          History and Physical    PCP:   Garlan Fillers, MD   Chief Complaint:  Fall with right hip pain  HPI: Patient is an 79 y/o woman with PMH significant for dementia, hypothyroidism, a fib on chronic anticoagulation with pradaxa who presents today following a fall at home. History is provided by the son with whom she lives with as patient cannot give detailed account of events given her dementia. Son states he sat her at the dining room table for breakfast, when she was done he took the plate into the kitchen. He states that he had his back turned towards her for maybe 5 seconds when he turned around and saw her grabbing onto the chair for support as she fell onto the hardwood floors, falling onto her right side and complaining of immediate right-sided hip pain. Xrays show a comminuted displaced oblique fracture of the proximal right femoral  Diaphysis. Dr. Turner Daniels has been consulted and is planning on taking her to the OR today. We have been asked to admit her for further evaluation and management.   Allergies:  No Known Allergies    Past Medical History  Diagnosis Date  . Hyperlipidemia   . Thyroid disease     hypothyroidism  . Psoriasis   . Osteopenia   . LBP (low back pain)     mild  . Palpitations     H/O  . Diabetes mellitus     Type II  . Alzheimer's disease     mild  . Arrhythmia 4/12    A-Fib @ [facility]  . Atrial fibrillation   . Stroke   . Hypertension     Past Surgical History  Procedure Laterality Date  . Cleft palate repair    . Myomectomy  1980  . Colonoscopy  1999  . Orif hip fracture  08/2002  . Cataract extraction  08/2008    Prior to Admission medications   Medication Sig Start Date End Date Taking? Authorizing Provider  Calcium-Vitamin D (CALTRATE 600 PLUS-VIT D PO) Take 600 mg by mouth daily.    Yes Historical Provider, MD  citalopram (CELEXA) 10 MG tablet Take 10 mg by mouth at bedtime.   Yes Historical  Provider, MD  dabigatran (PRADAXA) 150 MG CAPS Take 150 mg by mouth daily.    Yes Historical Provider, MD  donepezil (ARICEPT) 10 MG tablet Take 10 mg by mouth daily.    Yes Historical Provider, MD  levothyroxine (SYNTHROID) 25 MCG tablet Take 12.5 mcg by mouth daily.    Yes Historical Provider, MD  LORazepam (ATIVAN) 1 MG tablet Take 1 mg by mouth at bedtime.   Yes Historical Provider, MD  losartan-hydrochlorothiazide (HYZAAR) 100-12.5 MG per tablet Take 0.5 tablets by mouth daily.   Yes Historical Provider, MD  Trospium Chloride (SANCTURA XR) 60 MG CP24 Take 60 mg by mouth daily.    Yes Historical Provider, MD    Social History:  reports that she quit smoking about 64 years ago. Her smoking use included Cigarettes. She smoked 0.00 packs per day. She has never used smokeless tobacco. She reports that she does not drink alcohol or use illicit drugs.  Family History  Problem Relation Age of Onset  . Heart disease Brother     Review of Systems:  Unable to obtain given severe dementia.  Physical Exam: Blood pressure 162/66, pulse 70, temperature 97.6 F (36.4 C), temperature source Oral, resp. rate 16, SpO2 94.00%.  Gen: AA Ox3, NAD Neck: supple, no JVD, no LAD, no bruits, no goiter. HEENT: [place]/AT/PERRL/EOMI/moist mucous membranes CV: irregular, no M/R/G Lungs: CTA B Abd: S/NT/ND/+BS Ext: no C/C/E/+pedal pulses Neuro: grossly intact and non-focal.  Labs on Admission:  Results for orders placed during the hospital encounter of 10/01/13 (from the past 48 hour(s))  CBC WITH DIFFERENTIAL     Status: Abnormal   Collection Time    10/01/13 11:59 AM      Result Value Range   WBC 8.5  4.0 - 10.5 K/uL   RBC 4.50  3.87 - 5.11 MIL/uL   Hemoglobin 13.4  12.0 - 15.0 g/dL   HCT 81.1  91.4 - 78.2 %   MCV 87.8  78.0 - 100.0 fL   MCH 29.8  26.0 - 34.0 pg   MCHC 33.9  30.0 - 36.0 g/dL   RDW 95.6  21.3 - 08.6 %   Platelets 224  150 - 400 K/uL   Neutrophils Relative % 82 (*) 43 - 77 %   Neutro  Abs 6.9  1.7 - 7.7 K/uL   Lymphocytes Relative 11 (*) 12 - 46 %   Lymphs Abs 1.0  0.7 - 4.0 K/uL   Monocytes Relative 7  3 - 12 %   Monocytes Absolute 0.6  0.1 - 1.0 K/uL   Eosinophils Relative 0  0 - 5 %   Eosinophils Absolute 0.0  0.0 - 0.7 K/uL   Basophils Relative 0  0 - 1 %   Basophils Absolute 0.0  0.0 - 0.1 K/uL  COMPREHENSIVE METABOLIC PANEL     Status: Abnormal   Collection Time    10/01/13 11:59 AM      Result Value Range   Sodium 135  135 - 145 mEq/L   Potassium 3.5  3.5 - 5.1 mEq/L   Chloride 98  96 - 112 mEq/L   CO2 27  19 - 32 mEq/L   Glucose, Bld 219 (*) 70 - 99 mg/dL   BUN 17  6 - 23 mg/dL   Creatinine, Ser 5.78  0.50 - 1.10 mg/dL   Calcium 9.5  8.4 - 46.9 mg/dL   Total Protein 6.1  6.0 - 8.3 g/dL   Albumin 3.1 (*) 3.5 - 5.2 g/dL   AST 20  0 - 37 U/L   ALT 15  0 - 35 U/L   Alkaline Phosphatase 65  39 - 117 U/L   Total Bilirubin 0.4  0.3 - 1.2 mg/dL   GFR calc non Af Amer 57 (*) >90 mL/min   GFR calc Af Amer 66 (*) >90 mL/min   Comment: (NOTE)     The eGFR has been calculated using the CKD EPI equation.     This calculation has not been validated in all clinical situations.     eGFR's persistently <90 mL/min signify possible Chronic Kidney     Disease.  PROTIME-INR     Status: None   Collection Time    10/01/13 11:59 AM      Result Value Range   Prothrombin Time 14.2  11.6 - 15.2 seconds   INR 1.12  0.00 - 1.49  TROPONIN I     Status: None   Collection Time    10/01/13 11:59 AM      Result Value Range   Troponin I <0.30  <0.30 ng/mL   Comment:            Due to the release kinetics of cTnI,  a negative result within the first hours     of the onset of symptoms does not rule out     myocardial infarction with certainty.     If myocardial infarction is still suspected,     repeat the test at appropriate intervals.  TYPE AND SCREEN     Status: None   Collection Time    10/01/13 12:05 PM      Result Value Range   ABO/RH(D) A POS     Antibody  Screen NEG     Sample Expiration 10/04/2013    ABO/RH     Status: None   Collection Time    10/01/13 12:05 PM      Result Value Range   ABO/RH(D) A POS    URINALYSIS, ROUTINE W REFLEX MICROSCOPIC     Status: Abnormal   Collection Time    10/01/13 12:17 PM      Result Value Range   Color, Urine YELLOW  YELLOW   APPearance CLEAR  CLEAR   Specific Gravity, Urine 1.020  1.005 - 1.030   pH 6.5  5.0 - 8.0   Glucose, UA 100 (*) NEGATIVE mg/dL   Hgb urine dipstick NEGATIVE  NEGATIVE   Bilirubin Urine NEGATIVE  NEGATIVE   Ketones, ur NEGATIVE  NEGATIVE mg/dL   Protein, ur NEGATIVE  NEGATIVE mg/dL   Urobilinogen, UA 0.2  0.0 - 1.0 mg/dL   Nitrite NEGATIVE  NEGATIVE   Leukocytes, UA NEGATIVE  NEGATIVE   Comment: MICROSCOPIC NOT DONE ON URINES WITH NEGATIVE PROTEIN, BLOOD, LEUKOCYTES, NITRITE, OR GLUCOSE <1000 mg/dL.    Radiological Exams on Admission: Dg Chest 1 View  10/01/2013   CLINICAL DATA:  Preop for right femur fracture fixation.  EXAM: CHEST - 1 VIEW  COMPARISON:  09/10/2013  FINDINGS: Cardiac silhouette is mildly enlarged. The aorta is mildly uncoiled and tortuous. No mediastinal or hilar masses are appreciated.  There is vascular congestion without overt pulmonary edema. This is accentuated by relatively low lung volumes. No focal consolidation. No pleural effusion or pneumothorax. Minor left lung base atelectasis is stable from the prior exam.  The bony thorax is demineralized but grossly intact.  IMPRESSION: No acute cardiopulmonary disease.   Electronically Signed   By: Amie Portland M.D.   On: 10/01/2013 11:17   Dg Hip Complete Right  10/01/2013   CLINICAL DATA:  Fall with right hip and leg pain.  EXAM: RIGHT HIP - COMPLETE 2+ VIEW  COMPARISON:  01/19/2008.  FINDINGS: There is a comminuted oblique fracture of the proximal right femoral diaphysis which involves the lesser trochanter. The lesser trochanteric fragment is slightly medially displaced. There is 2.2 cm of posterior  displacement of the major distal fracture fragment displacement. There is 17 mm of medial displacement of the distal fracture fragment. There is no hip fracture or dislocation.  There is orthopedic hardware transfixing a old healed left proximal femoral fracture. There is generalized osteopenia. There are mild degenerative changes of bilateral SI joints. There are degenerative changes of the visualized lower lumbar spine.  IMPRESSION: Comminuted and displaced oblique fracture of the proximal right femoral diaphysis extending into the lesser trochanter.   Electronically Signed   By: Elige Ko   On: 10/01/2013 11:11   Dg Femur Right  10/01/2013   CLINICAL DATA:  Fall this morning. Severe right hip and leg pain.  EXAM: RIGHT FEMUR - 2 VIEW  COMPARISON:  Right hip x-ray of the same date.  FINDINGS: Comminuted oblique fracture of  the proximal right femoral diaphysis is noted. This involves the lesser trochanter. Lesser trochanter fragment is displaced medially. There is posterior and superior displacement of the distal fracture fragment. Posterior displacement is approximately 2.5 cm. Superior displacement is approximately 13 cm.  IMPRESSION: Comminuted displaced oblique fracture of the proximal right femoral diaphysis. This involves the lesser trochanter.   Electronically Signed   By: Jerene Dilling M.D.   On: 10/01/2013 11:18   Ct Head Wo Contrast  10/01/2013   CLINICAL DATA:  Post fall this morning, no loss of consciousness are head injury  EXAM: CT HEAD WITHOUT CONTRAST  CT CERVICAL SPINE WITHOUT CONTRAST  TECHNIQUE: Multidetector CT imaging of the head and cervical spine was performed following the standard protocol without intravenous contrast. Multiplanar CT image reconstructions of the cervical spine were also generated.  COMPARISON:  Head CT -09/10/2013; 02/20/2012  FINDINGS: CT HEAD FINDINGS  Redemonstrated advanced atrophy with sulcal prominence and centralized volume loss with commensurate mild  ex vacuo dilatation of the ventricular system. Unchanged lacunar infarcts within the periventricular white matter about the anterior horn of the right lateral ventricle and within the left cerebellum (image 9, series 4). Rather extensive periventricular hypodensities are grossly unchanged compatible with microvascular ischemic disease. Given back from parenchymal abnormalities, there is no CT evidence for acute large territory infarct. No intraparenchymal extra-axial mass or hemorrhage. Unchanged size and configuration of the ventricles and basilar cisterns. No midline shift. Intracranial atherosclerosis. Re demonstrated under pneumatization of the bilateral mastoid air cells. Remaining paranasal sinuses are normally aerated. Regional soft tissues are normal. Mild hyperostosis frontalis. No displaced calvarial fracture. Post bilateral cataract surgery.  CT CERVICAL SPINE FINDINGS  C1 to the inferior endplate of T2 is imaged.  Mild scoliotic curvature of the cervical spine, convex to the left, possibly positional. No anterolisthesis or retrolisthesis. The bilateral facets are normally aligned. The dens is normally positioned between the lateral masses of C1. Mild degenerative change of the atlantodental articulation. Normal atlantoaxial articulations.  No fracture or static subluxation of the cervical spine. Cervical vertebral body heights are preserved. Prevertebral soft tissues are normal.  There is mild to moderate multilevel cervical spine DDD, likely worse at C5-C6 and C6-C7 with disk space height loss, endplate irregularity and small posteriorly directed disk osteophyte complexes at these levels.  Calcifications within the bilateral carotid bulb. There is an exophytic at least 5.9 x 4.8 cm mixed attenuating mass which appears to arise from the caudal aspect of the right lobe of the thyroid it results in mild leftward deviation of the tracheal air column (image 72, series 5) - note, though the caudal extent of  this mass is not imaged on this examination. There is an additional approximately 1.7 x 1.5 cm nodule within the caudal aspect of the left lobe of the thyroid. No bulky cervical lymphadenopathy on this noncontrast examination.  Limited visualization of lung apices is normal.  IMPRESSION: 1. No acute intracranial process. 2. Stable findings of advanced atrophy and microvascular ischemic disease. 3. No fracture or static subluxation of the cervical spine. 4. Mild to moderate multilevel cervical spine DDD. 5. Exophytic at least 5.9 cm mass appears to arise from the caudal aspect of the right lobe of the thyroid. There is an additional approximately 1.7 cm nodule within the caudal aspect of the left lobe of the thyroid. Further evaluation with nonemergent thyroid ultrasound may be performed as clinically indicated. 6. Calcifications within the bilateral carotid bulbs. Further evaluation with nonemergent carotid Doppler ultrasound may  be performed as clinically indicated.   Electronically Signed   By: Simonne Come M.D.   On: 10/01/2013 12:05   Ct Cervical Spine Wo Contrast  10/01/2013   CLINICAL DATA:  Post fall this morning, no loss of consciousness are head injury  EXAM: CT HEAD WITHOUT CONTRAST  CT CERVICAL SPINE WITHOUT CONTRAST  TECHNIQUE: Multidetector CT imaging of the head and cervical spine was performed following the standard protocol without intravenous contrast. Multiplanar CT image reconstructions of the cervical spine were also generated.  COMPARISON:  Head CT -09/10/2013; 02/20/2012  FINDINGS: CT HEAD FINDINGS  Redemonstrated advanced atrophy with sulcal prominence and centralized volume loss with commensurate mild ex vacuo dilatation of the ventricular system. Unchanged lacunar infarcts within the periventricular white matter about the anterior horn of the right lateral ventricle and within the left cerebellum (image 9, series 4). Rather extensive periventricular hypodensities are grossly unchanged  compatible with microvascular ischemic disease. Given back from parenchymal abnormalities, there is no CT evidence for acute large territory infarct. No intraparenchymal extra-axial mass or hemorrhage. Unchanged size and configuration of the ventricles and basilar cisterns. No midline shift. Intracranial atherosclerosis. Re demonstrated under pneumatization of the bilateral mastoid air cells. Remaining paranasal sinuses are normally aerated. Regional soft tissues are normal. Mild hyperostosis frontalis. No displaced calvarial fracture. Post bilateral cataract surgery.  CT CERVICAL SPINE FINDINGS  C1 to the inferior endplate of T2 is imaged.  Mild scoliotic curvature of the cervical spine, convex to the left, possibly positional. No anterolisthesis or retrolisthesis. The bilateral facets are normally aligned. The dens is normally positioned between the lateral masses of C1. Mild degenerative change of the atlantodental articulation. Normal atlantoaxial articulations.  No fracture or static subluxation of the cervical spine. Cervical vertebral body heights are preserved. Prevertebral soft tissues are normal.  There is mild to moderate multilevel cervical spine DDD, likely worse at C5-C6 and C6-C7 with disk space height loss, endplate irregularity and small posteriorly directed disk osteophyte complexes at these levels.  Calcifications within the bilateral carotid bulb. There is an exophytic at least 5.9 x 4.8 cm mixed attenuating mass which appears to arise from the caudal aspect of the right lobe of the thyroid it results in mild leftward deviation of the tracheal air column (image 72, series 5) - note, though the caudal extent of this mass is not imaged on this examination. There is an additional approximately 1.7 x 1.5 cm nodule within the caudal aspect of the left lobe of the thyroid. No bulky cervical lymphadenopathy on this noncontrast examination.  Limited visualization of lung apices is normal.  IMPRESSION: 1.  No acute intracranial process. 2. Stable findings of advanced atrophy and microvascular ischemic disease. 3. No fracture or static subluxation of the cervical spine. 4. Mild to moderate multilevel cervical spine DDD. 5. Exophytic at least 5.9 cm mass appears to arise from the caudal aspect of the right lobe of the thyroid. There is an additional approximately 1.7 cm nodule within the caudal aspect of the left lobe of the thyroid. Further evaluation with nonemergent thyroid ultrasound may be performed as clinically indicated. 6. Calcifications within the bilateral carotid bulbs. Further evaluation with nonemergent carotid Doppler ultrasound may be performed as clinically indicated.   Electronically Signed   By: Simonne Come M.D.   On: 10/01/2013 12:05    Assessment/Plan Principal Problem:   Closed right hip fracture Active Problems:   Atrial fibrillation   Hyperlipidemia   Dementia   Hypothyroidism   Right Hip  Fracture -Dr. Turner Daniels planning on surgical repair today. -Will be at a higher risk for bleeding given her anticoagulation with Pradaxa, but do not believe we need to delay her surgery because of this.  Atrial Fibrillation -Rate controlled. -Ortho: please advise once ok to resume Pradaxa after surgery.  Hypothyroidism -Check TSH. -Continue synthroid.  Dementia -At baseline. -Continue aricept.  DVT Prophylaxis -Resume pradaxa after OR.  Code Status -Full Code as discussed with son at bedside.   Time Spent on Admission: 65 minutes  HERNANDEZ ACOSTA,ESTELA [facility] Pager: [phone] 10/01/2013, 2:49 PM

## 2013-10-01 NOTE — ED Notes (Signed)
Patient transported to X-ray 

## 2013-10-02 ENCOUNTER — Encounter (HOSPITAL_COMMUNITY): Payer: Self-pay | Admitting: Anesthesiology

## 2013-10-02 DIAGNOSIS — E079 Disorder of thyroid, unspecified: Secondary | ICD-10-CM

## 2013-10-02 LAB — CBC
HCT: 29.6 % — ABNORMAL LOW (ref 36.0–46.0)
Hemoglobin: 9.9 g/dL — ABNORMAL LOW (ref 12.0–15.0)
MCH: 29.4 pg (ref 26.0–34.0)
WBC: 6.9 10*3/uL (ref 4.0–10.5)

## 2013-10-02 LAB — GLUCOSE, CAPILLARY
Glucose-Capillary: 162 mg/dL — ABNORMAL HIGH (ref 70–99)
Glucose-Capillary: 208 mg/dL — ABNORMAL HIGH (ref 70–99)
Glucose-Capillary: 193 mg/dL — ABNORMAL HIGH (ref 70–99)

## 2013-10-02 LAB — BASIC METABOLIC PANEL
BUN: 18 mg/dL (ref 6–23)
Chloride: 97 mEq/L (ref 96–112)
GFR calc Af Amer: 67 mL/min — ABNORMAL LOW (ref >90)
Glucose, Bld: 195 mg/dL — ABNORMAL HIGH (ref 70–99)
Potassium: 3.9 mEq/L (ref 3.5–5.1)

## 2013-10-02 MED ORDER — MORPHINE SULFATE 2 MG/ML IJ SOLN
2.0000 mg | Freq: Once | INTRAMUSCULAR | Status: AC
  Administered 2013-10-02: 2 mg via INTRAVENOUS
  Filled 2013-10-02: qty 1

## 2013-10-02 MED ORDER — DABIGATRAN ETEXILATE MESYLATE 150 MG PO CAPS
150.0000 mg | ORAL_CAPSULE | Freq: Two times a day (BID) | ORAL | Status: DC
  Filled 2013-10-02 (×2): qty 1

## 2013-10-02 MED ORDER — OXYCODONE-ACETAMINOPHEN 5-325 MG PO TABS: 1.0000 | ORAL_TABLET | ORAL | Status: DC | PRN

## 2013-10-02 MED ORDER — DARIFENACIN HYDROBROMIDE ER 7.5 MG PO TB24
7.5000 mg | ORAL_TABLET | Freq: Every day | ORAL | Status: DC
  Administered 2013-10-02 – 2013-10-05 (×4): 7.5 mg via ORAL
  Filled 2013-10-02 (×4): qty 1

## 2013-10-02 MED ORDER — MORPHINE SULFATE 2 MG/ML IJ SOLN
1.0000 mg | INTRAMUSCULAR | Status: DC | PRN
  Administered 2013-10-02: 1 mg via INTRAVENOUS
  Filled 2013-10-02: qty 1

## 2013-10-02 MED ORDER — ENOXAPARIN SODIUM 30 MG/0.3ML ~~LOC~~ SOLN
30.0000 mg | SUBCUTANEOUS | Status: DC
  Administered 2013-10-02 – 2013-10-03 (×2): 30 mg via SUBCUTANEOUS
  Filled 2013-10-02 (×3): qty 0.3

## 2013-10-02 NOTE — Progress Notes (Signed)
Dawn Bradshaw 1231 TRANSFERRED TO 1601. NO CHANGE IN HER CONDITION. REPORT GIVEN TO 6E RN.

## 2013-10-02 NOTE — Evaluation (Signed)
Physical Therapy Evaluation Patient Details Name: Dawn Bradshaw MRN: [id number] DOB: 1927-11-25 Today's Date: 10/02/2013 Time: 4098-1191 PT Time Calculation (min): 33 min  PT Assessment / Plan / Recommendation History of Present Illness    Patient is an 79 y/o woman with PMH significant for dementia, hypothyroidism, a fib on chronic anticoagulation with pradaxa who presents today following a fall at home. History is provided by the son with whom she lives with as patient cannot give detailed account of events given her dementia. Son states he sat her at the dining room table for breakfast, when she was done he took the plate into the kitchen. He states that he had his back turned towards her for maybe 5 seconds when he turned around and saw her grabbing onto the chair for support as she fell onto the hardwood floors, falling onto her right side and complaining of immediate right-sided hip pain. Xrays show a comminuted displaced oblique fracture of the proximal right femoral  Diaphysis.   Clinical Impression  Pt s/p IM nailing presents with significant functional mobility limitations 2* decreased R LE strength/ROM, post op pain, PWB status and ongoing dementia which limits pt's ability to follow cues and fully participate in rehab.  Pt currently requires assist of 2 for all mobility tasks and has low tolerance for same 2* c/o pain with any movement.  Pt would benefit from follow up rehab at SNF level.    PT Assessment  Patient needs continued PT services    Follow Up Recommendations  SNF    Does the patient have the potential to tolerate intense rehabilitation      Barriers to Discharge        Equipment Recommendations  Wheelchair (measurements PT);Wheelchair cushion (measurements PT)    Recommendations for Other Services OT consult   Frequency Min 3X/week    Precautions / Restrictions Precautions Precautions: Fall Restrictions Weight Bearing Restrictions: Yes RLE Weight Bearing:  Partial weight bearing RLE Partial Weight Bearing Percentage or Pounds: 50%   Pertinent Vitals/Pain Pt indicates pain with any movement at R LE "that leg hurts a lot"  "why does that leg hurt like that?" but is unable to rate.      Mobility  Bed Mobility Bed Mobility: Supine to Sit;Sit to Supine Supine to Sit: 1: +2 Total assist;With rails;HOB elevated Supine to Sit: Patient Percentage: 10% Sit to Supine: 1: +2 Total assist Sit to Supine: Patient Percentage: 0% Details for Bed Mobility Assistance: Cues for sequencing and use of UEs and L LE to self assist.  Pt agreeable to all requests but with minimal follow through Transfers Transfers: Not assessed (Pt c/o pain constantly and following min cues to participate) Ambulation/Gait Ambulation/Gait Assistance: Not tested (comment)    Exercises     PT Diagnosis: Difficulty walking  PT Problem List: Decreased strength;Decreased range of motion;Decreased activity tolerance;Decreased mobility;Decreased cognition;Decreased knowledge of use of DME;Decreased knowledge of precautions;Decreased safety awareness;Obesity;Pain PT Treatment Interventions: DME instruction;Gait training;Functional mobility training;Therapeutic activities;Therapeutic exercise;Cognitive remediation;Patient/family education     PT Goals(Current goals can be found in the care plan section) Acute Rehab PT Goals Patient Stated Goal: Pt unable 2* cognition PT Goal Formulation: Patient unable to participate in goal setting Time For Goal Achievement: 10/09/13 Potential to Achieve Goals: Fair  Visit Information  Last PT Received On: 10/02/13 Assistance Needed: +2       Prior Functioning  Home Living Family/patient expects to be discharged to:: Other (Comment) Additional Comments: Pt unable to provide accurate information.  Pt states she lives "here" but sometimes with her sister or her children Prior Function Comments: Per notes in chart pt mobilized with RW but was  not using it at time of fall Communication Communication: No difficulties    Cognition  Cognition Arousal/Alertness: Awake/alert Behavior During Therapy: WFL for tasks assessed/performed Overall Cognitive Status: History of cognitive impairments - at baseline Memory: Decreased short-term memory    Extremity/Trunk Assessment Upper Extremity Assessment Upper Extremity Assessment: Overall WFL for tasks assessed Lower Extremity Assessment Lower Extremity Assessment: RLE deficits/detail RLE Deficits / Details: Pt unwilling to allow movement of R LE  RLE: Unable to fully assess due to pain   Balance    End of Session PT - End of Session Activity Tolerance: Patient limited by pain Patient left: in bed;with call bell/phone within reach;with nursing/sitter in room Nurse Communication: Mobility status  GP     Anarosa Kubisiak 10/02/2013, 4:32 PM

## 2013-10-02 NOTE — Progress Notes (Addendum)
Patient ID: Dawn Bradshaw, female   DOB: 20-Jun-1927, 79 y.o.   MRN: [id number] PATIENT ID: Dawn Bradshaw  MRN: [id number]  DOB/AGE:  01/10/1927 / 79 y.o.  1 Day Post-Op Procedure(s) (LRB): INTRAMEDULLARY (IM) NAIL FEMORAL (Right)    PROGRESS NOTE Subjective: Patient is alert, oriented,no Nausea, no Vomiting, yes passing gas, no Bowel Movement. Taking PO sips. Denies SOB, Chest or Calf Pain. Using Incentive Spirometer, PAS in place. Ambulate patient may ambulate 50% weightbearing with physical therapist or nurse physically present Patient reports pain as 5 on 0-10 scale and 7 on 0-10 scale, nurse reports that 2 mg morphine IV did not affect her breathing and did control her pain for 2 hours I have upper morphine to 1-2 mg every 2 hours when necessary  .    Objective: Vital signs in last 24 hours: Filed Vitals:   10/02/13 0400 10/02/13 0500 10/02/13 0600 10/02/13 0700  BP: 140/66 126/47 130/55 136/113  Pulse: 100 90 97 90  Temp: 98.2 F (36.8 C)     TempSrc: Oral     Resp: 16 20 22 25   Height:      Weight:      SpO2: 96% 93% 97% 95%      Intake/Output from previous day: I/O last 3 completed shifts: In: 2062.5 [I.V.:2062.5] Out: 1145 [Urine:745; Blood:400]   Intake/Output this shift:     LABORATORY DATA:  Recent Labs  10/01/13 1159 10/01/13 1712 10/01/13 2105 10/01/13 2339 10/02/13 0340  WBC 8.5  --   --   --  6.9  HGB 13.4  --   --   --  9.9*  HCT 39.5  --   --   --  29.6*  PLT 224  --   --   --  183  NA 135  --   --   --  133*  K 3.5  --   --   --  3.9  CL 98  --   --   --  97  CO2 27  --   --   --  25  BUN 17  --   --   --  18  CREATININE 0.90  --   --   --  0.89  GLUCOSE 219*  --   --   --  195*  GLUCAP  --  155* 126* 146*  --   INR 1.12  --   --   --   --   CALCIUM 9.5  --   --   --  9.0    Examination: Neurologically intact ABD soft Neurovascular intact Sensation intact distally Intact pulses distally Dorsiflexion/Plantar flexion intact Incision: no  drainage No cellulitis present Compartment soft}   Assessment:   1 Day Post-Op Procedure(s) (LRB): INTRAMEDULLARY (IM) NAIL FEMORAL (Right) ADDITIONAL DIAGNOSIS:  Moderate Alzheimer's disease, atrial fibrillation controlled with Pradaxa, history of recent UTI that required hospitalization for sepsis a few weeks ago  Plan: PT/OT WBAT, THA  posterior precautions  DVT Prophylaxis: SCDx72 hrs, Lovenox while in the hospital, restart Pradaxa 150 mg by mouth daily when transferred to skilled nursing facility for DVT prophylaxis and for her atrial fibrillation. DISCHARGE PLAN: Skilled Nursing Facility/Rehab, will probably need skilled nursing for the next couple of months while the hip and femur fracture heals. Patient is chronically unsteady even with walker and has significant fall risk per family.  DISCHARGE NEEDS: HHPT, HHRN, CPM, Walker and 3-in-1 comode seat

## 2013-10-02 NOTE — Progress Notes (Signed)
Pt c/o needing to void. Manipulating foley obtained no urine. Bladder scanned, indicated no urine. Reassured pt & made certain that floey fastened securely to leg. Kamille Toomey, [employer]

## 2013-10-02 NOTE — Progress Notes (Signed)
[facility] PROGRESS NOTE  Dawn Bradshaw ZOX:[id number] DOB: Apr 01, 1927 DOA: 10/01/2013 PCP: Garlan Fillers, MD  Assessment/Plan: Right Hip Fracture -s/p repair. -As per ortho.  Atrial Fibrillation -Ortho: please advise when ok to start Pradaxa from your standpoint. -Rate controlled.  Hypothyroidism -Continue synthroid.  Dementia -At baseline. -Continue aricept.  Thyroid Mass -Can follow up as an OP with a thyroid US.  Code Status: Full Code Family Communication: Son at bedside updated on plan of care.  Disposition Plan: SNF.   Consultants:  Ortho   Antibiotics:  None   Subjective: C/o pain with movement.  Objective: Filed Vitals:   10/02/13 0500 10/02/13 0600 10/02/13 0700 10/02/13 0800  BP: 126/47 130/55 136/113   Pulse: 90 97 90   Temp:    97.7 F (36.5 C)  TempSrc:    Oral  Resp: 20 22 25    Height:      Weight:      SpO2: 93% 97% 95%     Intake/Output Summary (Last 24 hours) at 10/02/13 0942 Last data filed at 10/02/13 0600  Gross per 24 hour  Intake 2062.5 ml  Output   1145 ml  Net  917.5 ml   Filed Weights   10/01/13 1535 10/01/13 2327  Weight: 70.3 kg (154 lb 15.7 oz) 77.2 kg (170 lb 3.1 oz)    Exam:   General:  Awake, alert  Cardiovascular: Irregular  Respiratory: CTA B  Abdomen: S/NT/ND/+BS  Extremities: no C/C/E  Neurologic:  Non-focal  Data Reviewed: Basic Metabolic Panel:  Recent Labs Lab 10/01/13 1159 10/02/13 0340  NA 135 133*  K 3.5 3.9  CL 98 97  CO2 27 25  GLUCOSE 219* 195*  BUN 17 18  CREATININE 0.90 0.89  CALCIUM 9.5 9.0   Liver Function Tests:  Recent Labs Lab 10/01/13 1159  AST 20  ALT 15  ALKPHOS 65  BILITOT 0.4  PROT 6.1  ALBUMIN 3.1*   No results found for this basename: LIPASE, AMYLASE,  in the last 168 hours No results found for this basename: AMMONIA,  in the last 168 hours CBC:  Recent Labs Lab 10/01/13 1159 10/02/13 0340  WBC 8.5 6.9  NEUTROABS 6.9  --   HGB  13.4 9.9*  HCT 39.5 29.6*  MCV 87.8 87.8  PLT 224 183   Cardiac Enzymes:  Recent Labs Lab 10/01/13 1159  TROPONINI <0.30   BNP (last 3 results) No results found for this basename: PROBNP,  in the last 8760 hours CBG:  Recent Labs Lab 10/01/13 1712 10/01/13 2105 10/01/13 2339  GLUCAP 155* 126* 146*    Recent Results (from the past 240 hour(s))  SURGICAL PCR SCREEN     Status: None   Collection Time    10/01/13  3:52 PM      Result Value Range Status   MRSA, PCR NEGATIVE  NEGATIVE Final   Staphylococcus aureus NEGATIVE  NEGATIVE Final   Comment:            The Xpert SA Assay (FDA     approved for NASAL specimens     in patients over 4 years of age),     is one component of     a comprehensive surveillance     program.  Test performance has     been validated by [employer] for patients greater     than or equal to 63 year old.     It is not intended  to diagnose infection nor to     guide or monitor treatment.     Studies: Dg Chest 1 View  10/01/2013   CLINICAL DATA:  Preop for right femur fracture fixation.  EXAM: CHEST - 1 VIEW  COMPARISON:  09/10/2013  FINDINGS: Cardiac silhouette is mildly enlarged. The aorta is mildly uncoiled and tortuous. No mediastinal or hilar masses are appreciated.  There is vascular congestion without overt pulmonary edema. This is accentuated by relatively low lung volumes. No focal consolidation. No pleural effusion or pneumothorax. Minor left lung base atelectasis is stable from the prior exam.  The bony thorax is demineralized but grossly intact.  IMPRESSION: No acute cardiopulmonary disease.   Electronically Signed   By: Amie Portland M.D.   On: 10/01/2013 11:17   Dg Hip Complete Right  10/01/2013   CLINICAL DATA:  Fall with right hip and leg pain.  EXAM: RIGHT HIP - COMPLETE 2+ VIEW  COMPARISON:  01/19/2008.  FINDINGS: There is a comminuted oblique fracture of the proximal right femoral diaphysis which involves the lesser  trochanter. The lesser trochanteric fragment is slightly medially displaced. There is 2.2 cm of posterior displacement of the major distal fracture fragment displacement. There is 17 mm of medial displacement of the distal fracture fragment. There is no hip fracture or dislocation.  There is orthopedic hardware transfixing a old healed left proximal femoral fracture. There is generalized osteopenia. There are mild degenerative changes of bilateral SI joints. There are degenerative changes of the visualized lower lumbar spine.  IMPRESSION: Comminuted and displaced oblique fracture of the proximal right femoral diaphysis extending into the lesser trochanter.   Electronically Signed   By: Elige Ko   On: 10/01/2013 11:11   Dg Femur Right  10/01/2013   CLINICAL DATA:  Internal fixation  EXAM: RIGHT FEMUR - 2 VIEW; DG C-ARM 61-120 MIN - NRPT [facility]  COMPARISON:  Earlier films of the same day  FINDINGS: Interval placement of IM rod with 2 cerclage wires, distal interlocking screw, and interlocking sliding screw in the neck across the proximal femoral fracture, fragments in near anatomic alignment. The lesser trochanter fragment remains displaced medially.  IMPRESSION: Internal fixation of proximal femoral fracture.   Electronically Signed   By: Oley Balm M.D.   On: 10/01/2013 20:58   Dg Femur Right  10/01/2013   CLINICAL DATA:  Fall this morning. Severe right hip and leg pain.  EXAM: RIGHT FEMUR - 2 VIEW  COMPARISON:  Right hip x-ray of the same date.  FINDINGS: Comminuted oblique fracture of the proximal right femoral diaphysis is noted. This involves the lesser trochanter. Lesser trochanter fragment is displaced medially. There is posterior and superior displacement of the distal fracture fragment. Posterior displacement is approximately 2.5 cm. Superior displacement is approximately 13 cm.  IMPRESSION: Comminuted displaced oblique fracture of the proximal right femoral diaphysis. This involves the lesser  trochanter.   Electronically Signed   By: Jerene Dilling M.D.   On: 10/01/2013 11:18   Ct Head Wo Contrast  10/01/2013   CLINICAL DATA:  Post fall this morning, no loss of consciousness are head injury  EXAM: CT HEAD WITHOUT CONTRAST  CT CERVICAL SPINE WITHOUT CONTRAST  TECHNIQUE: Multidetector CT imaging of the head and cervical spine was performed following the standard protocol without intravenous contrast. Multiplanar CT image reconstructions of the cervical spine were also generated.  COMPARISON:  Head CT -09/10/2013; 02/20/2012  FINDINGS: CT HEAD FINDINGS  Redemonstrated advanced atrophy with sulcal prominence and  centralized volume loss with commensurate mild ex vacuo dilatation of the ventricular system. Unchanged lacunar infarcts within the periventricular white matter about the anterior horn of the right lateral ventricle and within the left cerebellum (image 9, series 4). Rather extensive periventricular hypodensities are grossly unchanged compatible with microvascular ischemic disease. Given back from parenchymal abnormalities, there is no CT evidence for acute large territory infarct. No intraparenchymal extra-axial mass or hemorrhage. Unchanged size and configuration of the ventricles and basilar cisterns. No midline shift. Intracranial atherosclerosis. Re demonstrated under pneumatization of the bilateral mastoid air cells. Remaining paranasal sinuses are normally aerated. Regional soft tissues are normal. Mild hyperostosis frontalis. No displaced calvarial fracture. Post bilateral cataract surgery.  CT CERVICAL SPINE FINDINGS  C1 to the inferior endplate of T2 is imaged.  Mild scoliotic curvature of the cervical spine, convex to the left, possibly positional. No anterolisthesis or retrolisthesis. The bilateral facets are normally aligned. The dens is normally positioned between the lateral masses of C1. Mild degenerative change of the atlantodental articulation. Normal atlantoaxial  articulations.  No fracture or static subluxation of the cervical spine. Cervical vertebral body heights are preserved. Prevertebral soft tissues are normal.  There is mild to moderate multilevel cervical spine DDD, likely worse at C5-C6 and C6-C7 with disk space height loss, endplate irregularity and small posteriorly directed disk osteophyte complexes at these levels.  Calcifications within the bilateral carotid bulb. There is an exophytic at least 5.9 x 4.8 cm mixed attenuating mass which appears to arise from the caudal aspect of the right lobe of the thyroid it results in mild leftward deviation of the tracheal air column (image 72, series 5) - note, though the caudal extent of this mass is not imaged on this examination. There is an additional approximately 1.7 x 1.5 cm nodule within the caudal aspect of the left lobe of the thyroid. No bulky cervical lymphadenopathy on this noncontrast examination.  Limited visualization of lung apices is normal.  IMPRESSION: 1. No acute intracranial process. 2. Stable findings of advanced atrophy and microvascular ischemic disease. 3. No fracture or static subluxation of the cervical spine. 4. Mild to moderate multilevel cervical spine DDD. 5. Exophytic at least 5.9 cm mass appears to arise from the caudal aspect of the right lobe of the thyroid. There is an additional approximately 1.7 cm nodule within the caudal aspect of the left lobe of the thyroid. Further evaluation with nonemergent thyroid ultrasound may be performed as clinically indicated. 6. Calcifications within the bilateral carotid bulbs. Further evaluation with nonemergent carotid Doppler ultrasound may be performed as clinically indicated.   Electronically Signed   By: Simonne Come M.D.   On: 10/01/2013 12:05   Ct Cervical Spine Wo Contrast  10/01/2013   CLINICAL DATA:  Post fall this morning, no loss of consciousness are head injury  EXAM: CT HEAD WITHOUT CONTRAST  CT CERVICAL SPINE WITHOUT CONTRAST   TECHNIQUE: Multidetector CT imaging of the head and cervical spine was performed following the standard protocol without intravenous contrast. Multiplanar CT image reconstructions of the cervical spine were also generated.  COMPARISON:  Head CT -09/10/2013; 02/20/2012  FINDINGS: CT HEAD FINDINGS  Redemonstrated advanced atrophy with sulcal prominence and centralized volume loss with commensurate mild ex vacuo dilatation of the ventricular system. Unchanged lacunar infarcts within the periventricular white matter about the anterior horn of the right lateral ventricle and within the left cerebellum (image 9, series 4). Rather extensive periventricular hypodensities are grossly unchanged compatible with microvascular ischemic disease. Given  back from parenchymal abnormalities, there is no CT evidence for acute large territory infarct. No intraparenchymal extra-axial mass or hemorrhage. Unchanged size and configuration of the ventricles and basilar cisterns. No midline shift. Intracranial atherosclerosis. Re demonstrated under pneumatization of the bilateral mastoid air cells. Remaining paranasal sinuses are normally aerated. Regional soft tissues are normal. Mild hyperostosis frontalis. No displaced calvarial fracture. Post bilateral cataract surgery.  CT CERVICAL SPINE FINDINGS  C1 to the inferior endplate of T2 is imaged.  Mild scoliotic curvature of the cervical spine, convex to the left, possibly positional. No anterolisthesis or retrolisthesis. The bilateral facets are normally aligned. The dens is normally positioned between the lateral masses of C1. Mild degenerative change of the atlantodental articulation. Normal atlantoaxial articulations.  No fracture or static subluxation of the cervical spine. Cervical vertebral body heights are preserved. Prevertebral soft tissues are normal.  There is mild to moderate multilevel cervical spine DDD, likely worse at C5-C6 and C6-C7 with disk space height loss, endplate  irregularity and small posteriorly directed disk osteophyte complexes at these levels.  Calcifications within the bilateral carotid bulb. There is an exophytic at least 5.9 x 4.8 cm mixed attenuating mass which appears to arise from the caudal aspect of the right lobe of the thyroid it results in mild leftward deviation of the tracheal air column (image 72, series 5) - note, though the caudal extent of this mass is not imaged on this examination. There is an additional approximately 1.7 x 1.5 cm nodule within the caudal aspect of the left lobe of the thyroid. No bulky cervical lymphadenopathy on this noncontrast examination.  Limited visualization of lung apices is normal.  IMPRESSION: 1. No acute intracranial process. 2. Stable findings of advanced atrophy and microvascular ischemic disease. 3. No fracture or static subluxation of the cervical spine. 4. Mild to moderate multilevel cervical spine DDD. 5. Exophytic at least 5.9 cm mass appears to arise from the caudal aspect of the right lobe of the thyroid. There is an additional approximately 1.7 cm nodule within the caudal aspect of the left lobe of the thyroid. Further evaluation with nonemergent thyroid ultrasound may be performed as clinically indicated. 6. Calcifications within the bilateral carotid bulbs. Further evaluation with nonemergent carotid Doppler ultrasound may be performed as clinically indicated.   Electronically Signed   By: Simonne Come M.D.   On: 10/01/2013 12:05   Dg C-arm 61-120 Min-no Report  10/01/2013   CLINICAL DATA:  Internal fixation  EXAM: RIGHT FEMUR - 2 VIEW; DG C-ARM 61-120 MIN - [facility]PT [facility]  COMPARISON:  Earlier films of the same day  FINDINGS: Interval placement of IM rod with 2 cerclage wires, distal interlocking screw, and interlocking sliding screw in the neck across the proximal femoral fracture, fragments in near anatomic alignment. The lesser trochanter fragment remains displaced medially.  IMPRESSION: Internal fixation of  proximal femoral fracture.   Electronically Signed   By: Oley Balm M.D.   On: 10/01/2013 20:58    Scheduled Meds: . citalopram  10 mg Oral QHS  . darifenacin  7.5 mg Oral Daily  . docusate sodium  100 mg Oral BID  . donepezil  10 mg Oral Daily  . enoxaparin (LOVENOX) injection  30 mg Subcutaneous Q24H  . losartan  50 mg Oral Daily   And  . hydrochlorothiazide  13 mg Oral Daily  . HYDROmorphone      . levothyroxine  12.5 mcg Oral QAC breakfast   Continuous Infusions: . sodium chloride Stopped (10/02/13  1478)  . dextrose 5 % and 0.45 % NaCl with KCl 20 mEq/L 100 mL/hr at 10/02/13 0103    Principal Problem:   Closed right hip fracture Active Problems:   Atrial fibrillation   Hyperlipidemia   Dementia   Hypothyroidism   Thyroid mass    Time spent: 35 minutes.    Chaya Jan  [facility] Pager [phone]  If 7PM-7AM, please contact night-coverage at [email or web address], password [facility] 10/02/2013, 9:42 AM  LOS: 1 day

## 2013-10-02 NOTE — Plan of Care (Signed)
Problem: Phase III Progression Outcomes Goal: Activity at appropriate level-compared to baseline (UP IN CHAIR FOR HEMODIALYSIS)  N/A

## 2013-10-03 LAB — BASIC METABOLIC PANEL
BUN: 13 mg/dL (ref 6–23)
Chloride: 96 mEq/L (ref 96–112)
GFR calc Af Amer: 72 mL/min — ABNORMAL LOW (ref >90)
GFR calc non Af Amer: 62 mL/min — ABNORMAL LOW (ref >90)
Glucose, Bld: 175 mg/dL — ABNORMAL HIGH (ref 70–99)
Potassium: 3.6 mEq/L (ref 3.5–5.1)
Sodium: 131 mEq/L — ABNORMAL LOW (ref 135–145)

## 2013-10-03 LAB — CBC
HCT: 27.5 % — ABNORMAL LOW (ref 36.0–46.0)
Hemoglobin: 9.2 g/dL — ABNORMAL LOW (ref 12.0–15.0)
RDW: 13.1 % (ref 11.5–15.5)
WBC: 8.3 10*3/uL (ref 4.0–10.5)

## 2013-10-03 LAB — GLUCOSE, CAPILLARY: Glucose-Capillary: 165 mg/dL — ABNORMAL HIGH (ref 70–99)

## 2013-10-03 NOTE — Progress Notes (Signed)
I went by to see [name] and to discuss the operative events with family members as I had not had the ability to do so yet. Only Dawn Bradshaw was at the bedside during my visit. I discussed with her and the patient how I had missed that she had received pradaxa. I had been under the impression she was on warfarin, and that her normal coags meant it was safe to proceed with spinal anesthetic. After I had placed the spinal is when I learned she is on pradaxa. Dawn Bradshaw and I spoke about her care currently and how she will hopefully be able to work with PT today. I did a limited neuro exam as the patient was not cooperative with much of the exam due to pain. She had full motor strength in bilateral LE to the best of my ability. I will continue to follow Dawn Bradshaw's progress closely.

## 2013-10-03 NOTE — Progress Notes (Signed)
Patient ID: Dawn Bradshaw, female   DOB: Jun 29, 1927, 79 y.o.   MRN: [id number] PATIENT ID: Dawn Bradshaw  MRN: [id number]  DOB/AGE:  24-Jun-1927 / 79 y.o.  2 Days Post-Op Procedure(s) (LRB): INTRAMEDULLARY (IM) NAIL FEMORAL (Right)    PROGRESS NOTE Subjective: Patient is alert, oriented,no Nausea, no Vomiting, yes passing gas, no Bowel Movement. Taking PO well. Denies SOB, Chest or Calf Pain. Ambulate WBAT, but moderate Alzheimer's disease prevents full arches the patient in physical therapy. Patient reports no pain at rest but when you outpatient or manipulate her right lower extremity she does have discomfort.   Objective: Vital signs in last 24 hours: Filed Vitals:   10/02/13 2000 10/02/13 2128 10/03/13 0000 10/03/13 0615  BP:  137/75  142/83  Pulse:  90  69  Temp:  98.8 F (37.1 C)  98.7 F (37.1 C)  TempSrc:  Oral  Oral  Resp: 15 19 15 16   Height:      Weight:      SpO2:  98%  98%      Intake/Output from previous day: I/O last 3 completed shifts: In: 3862.5 [P.O.:1140; I.V.:2722.5] Out: 2545 [Urine:2295; Blood:250]   Intake/Output this shift:     LABORATORY DATA:  Recent Labs  10/01/13 1159  10/02/13 0340 10/02/13 1233 10/02/13 1647 10/02/13 2123 10/03/13 0412  WBC 8.5  --  6.9  --   --   --  8.3  HGB 13.4  --  9.9*  --   --   --  9.2*  HCT 39.5  --  29.6*  --   --   --  27.5*  PLT 224  --  183  --   --   --  167  NA 135  --  133*  --   --   --  131*  K 3.5  --  3.9  --   --   --  3.6  CL 98  --  97  --   --   --  96  CO2 27  --  25  --   --   --  28  BUN 17  --  18  --   --   --  13  CREATININE 0.90  --  0.89  --   --   --  0.84  GLUCOSE 219*  --  195*  --   --   --  175*  GLUCAP  --   < >  --  162* 208* 193*  --   INR 1.12  --   --   --   --   --   --   CALCIUM 9.5  --  9.0  --   --   --  8.7  < > = values in this interval not displayed.  Examination: Neurologically intact ABD soft Neurovascular intact Sensation intact distally Intact pulses  distally Dorsiflexion/Plantar flexion intact Incision: dressing C/D/I No cellulitis present Compartment soft} Dressing is off of her right lower extremity, staple line is clean and dry, 1-2+ bruising the subcutaneous tissue around the staple line appear. She moves both feet up and down without difficulty and has normal sensation to her toes. Assessment:   2 Days Post-Op Procedure(s) (LRB): INTRAMEDULLARY (IM) NAIL FEMORAL (Right) ADDITIONAL DIAGNOSIS:  Atrial fibrillation, moderate Alzheimer's disease, hypothyroidism, mitral regurgitation  Plan: PT/OT, 50% weightbearing, although it is understood her moderate dementia will prevent full physical therapy participation. She will need a minimum of 1-2 months in a skilled  nursing facility and based on her cooperation with therapy this may the long-term.  DVT Prophylaxis: SCDx72 hrs, Patient got a spinal anesthetic 10/01/2013 @1730 , on Pradaxa. Risk of spinal hematoma. Anesthesia prefers no anti-coagulation for 2-3 days but felt low dose lovenox for 2-3 days was acceptable, watching neuro status closely. Has mild bruising around wound @ 0720 10/03/2013. Will restart Pradaxa 10/04/2013.   DISCHARGE PLAN: Skilled Nursing Facility/Rehab  DISCHARGE NEEDS: HHPT, HHRN, CPM, Walker and 3-in-1 comode seat

## 2013-10-03 NOTE — Progress Notes (Signed)
Clinical Social Work Department BRIEF PSYCHOSOCIAL ASSESSMENT 10/03/2013  Patient:  Dawn Bradshaw, Dawn Bradshaw     Account Number:  [id number]     Admit date:  10/01/2013  Clinical Social Worker:  Doroteo Glassman  Date/Time:  10/03/2013 12:18 PM  Referred by:  Physician  Date Referred:  10/03/2013 Referred for  SNF Placement   Other Referral:   Interview type:  Other - See comment Other interview type:   Pt's son, Mr. Dawn Bradshaw.    PSYCHOSOCIAL DATA Living Status:  FAMILY Admitted from facility:   Level of care:   Primary support name:  Dawn Bradshaw Primary support relationship to patient:  CHILD, ADULT Degree of support available:   strong    CURRENT CONCERNS Current Concerns  Post-Acute Placement   Other Concerns:    SOCIAL WORK ASSESSMENT / PLAN Spoke with Pt's son, Dawn Bradshaw, via phone.    Pt's son stated that he has been in touch with [facility], as Pt has been there in the past, and that they said they'd be happy to have her again.    Pt's son gave CSW permission to contact [facility] on his behalf to officially inquire about a bed for Pt.    CSW thanked Mr. Dawn Bradshaw for his time.   Assessment/plan status:  Psychosocial Support/Ongoing Assessment of Needs Other assessment/ plan:   Information/referral to community resources:   Son only wanting [facility] and Rehab    PATIENT'S/FAMILY'S RESPONSE TO PLAN OF CARE: Pt's son's response to the plan of care was acceptance.  He stated that he's been caring for his mom for many years and that he tends to all of her needs.  He is happy for Pt to return to [facility]hab.    Pt's son thanked CSW for time and assistance.   Providence Crosby, LCSWA Clinical Social Work [phone]

## 2013-10-03 NOTE — Progress Notes (Signed)
Occupational Therapy Evaluation Patient Details Name: Dawn Bradshaw MRN: [id number] DOB: 01/25/1927 Today's Date: 10/03/2013 Time: 0840-0900 OT Time Calculation (min): 20 min  OT Assessment / Plan / Recommendation History of present illness   Patient is an 79 y/o woman with PMH significant for dementia, hypothyroidism, a fib on chronic anticoagulation with pradaxa who presents today following a fall at home. History is provided by the son with whom she lives with as patient cannot give detailed account of events given her dementia. Son states he sat her at the dining room table for breakfast, when she was done he took the plate into the kitchen. He states that he had his back turned towards her for maybe 5 seconds when he turned around and saw her grabbing onto the chair for support as she fell onto the hardwood floors, falling onto her right side and complaining of immediate right-sided hip pain. Xrays show a comminuted displaced oblique fracture of the proximal right femoral     Clinical Impression   Pt with significant functional decline. Pt oriented to self only. No family available. Pt total A with all LB ADL and +2 with mobility. Unable to maintain 50% WBS. Pt will need SNF for rehab to return to PLOF.     OT Assessment  All further OT needs can be met in the next venue of care    Follow Up Recommendations  SNF    Barriers to Discharge      Equipment Recommendations  None recommended by OT    Recommendations for Other Services    Frequency       Precautions / Restrictions Precautions Precautions: Fall Restrictions Weight Bearing Restrictions: Yes RLE Weight Bearing: Partial weight bearing RLE Partial Weight Bearing Percentage or Pounds: 50%   Pertinent Vitals/Pain Unable to stae. Grimaces/voices pain with minimal activity    ADL  Eating/Feeding: Set up Where Assessed - Eating/Feeding: Edge of bed Grooming: Moderate assistance Where Assessed - Grooming: Supported  sitting Upper Body Bathing: Moderate assistance Where Assessed - Upper Body Bathing: Supported sitting Lower Body Bathing: +1 Total assistance Where Assessed - Lower Body Bathing: Rolling right and/or left Upper Body Dressing: Moderate assistance Where Assessed - Upper Body Dressing: Supported sitting Lower Body Dressing: +1 Total assistance Where Assessed - Lower Body Dressing: Lean right and/or left Toilet Transfer: +2 Total assistance Toileting - Clothing Manipulation and Hygiene: +2 Total assistance Transfers/Ambulation Related to ADLs: +2 total A will be needed. Pt unable to stand at this time ADL Comments: total A with all LB ADL. mod A with UB ADL    OT Diagnosis: Generalized weakness;Acute pain;Cognitive deficits  OT Problem List: Decreased strength;Decreased range of motion;Decreased activity tolerance;Impaired balance (sitting and/or standing);Decreased cognition;Decreased safety awareness;Decreased knowledge of use of DME or AE;Decreased knowledge of precautions;Pain;Impaired UE functional use OT Treatment Interventions:     OT Goals(Current goals can be found in the care plan section) Acute Rehab OT Goals Patient Stated Goal: Pt unable 2* cognition OT Goal Formulation:  (eval only)  Visit Information  Last OT Received On: 10/03/13 Assistance Needed: +2       Prior Functioning     Home Living Family/patient expects to be discharged to:: Other (Comment) Additional Comments: Pt unable to provide accurate information.  Pt states she lives "here" but sometimes with her sister or her children Prior Function Comments: Per notes in chart pt mobilized with RW but was not using it at time of fall Communication Communication: No difficulties  Vision/Perception Vision - History Baseline Vision: Wears glasses all the time   Cognition  Cognition Arousal/Alertness: Lethargic;Suspect due to medications Behavior During Therapy: Flat affect Overall Cognitive  Status: History of cognitive impairments - at baseline Memory: Decreased short-term memory    Extremity/Trunk Assessment Upper Extremity Assessment Upper Extremity Assessment: RUE deficits/detail;LUE deficits/detail RUE Deficits / Details: only @ 40 degrees shoulder FF. limited ROM. poor strength LUE Deficits / Details: LUE with minimal shoulder FF - @ 10 degrees. Apparent RTC dysfunction. crepetice noted. Lower Extremity Assessment RLE Deficits / Details: c/o pain when only touching RLE. total assist to move     Mobility Bed Mobility Bed Mobility: Supine to Sit;Sit to Supine Supine to Sit: 1: +2 Total assist;With rails;HOB elevated Supine to Sit: Patient Percentage: 10% Sit to Supine: 1: +2 Total assist Sit to Supine: Patient Percentage: 0% Details for Bed Mobility Assistance: Pt unable to assist in mobility. Limited shoulder ROM and strength impair ability to help in addition to cognitive deficits Transfers Transfers: Not assessed (pt unable at this time)     Exercise     Balance     End of Session OT - End of Session Activity Tolerance: Patient limited by pain Patient left: in bed;with call bell/phone within reach Nurse Communication: Mobility status  GO     Dawn Bradshaw,Dawn Bradshaw 10/03/2013, 9:10 AM Dawn Bradshaw, OTR/L  [phone] 10/03/2013

## 2013-10-03 NOTE — Progress Notes (Signed)
   CARE MANAGEMENT NOTE 10/03/2013  Patient:  SYDNA, BRODOWSKI   Account Number:  [id number]  Date Initiated:  10/01/2013  Documentation initiated by:  [facility]  Subjective/Objective Assessment:   Right proximal femur long spiral fracture     Action/Plan:   son, Arlys John  waiting PT/OT follow up   Anticipated DC Date:  10/04/2013   Anticipated DC Plan:  SKILLED NURSING FACILITY  In-house referral  Clinical Social Worker      DC Planning Services  CM consult      Choice offered to / List presented to:             Status of service:  Completed, signed off Medicare Important Message given?   (If response is "NO", the following Medicare IM given date fields will be blank) Date Medicare IM given:   Date Additional Medicare IM given:    Discharge Disposition:  SKILLED NURSING FACILITY  Per UR Regulation:    If discussed at Long Length of Stay Meetings, dates discussed:    Comments:  10/03/2013 10:24 AM  NCM spoke to pt and she wants to go to SNF-rehab. Isidoro Donning RN CCM Case Mgmt phone [phone]

## 2013-10-03 NOTE — Progress Notes (Addendum)
Clinical Social Work Department CLINICAL SOCIAL WORK PLACEMENT NOTE 10/03/2013  Patient:  Dawn Bradshaw, Dawn Bradshaw  Account Number:  [id number] Admit date:  10/01/2013  Clinical Social Worker:  Doroteo Glassman  Date/time:  10/03/2013 12:22 PM  Clinical Social Work is seeking post-discharge placement for this patient at the following level of care:   SKILLED NURSING   (*CSW will update this form in Epic as items are completed)   Pt's son wants [facility]  Patient/family provided with [facility] Department of Clinical Social Work's list of facilities offering this level of care within the geographic area requested by the patient (or if unable, by the patient's family).  10/03/2013  Patient/family informed of their freedom to choose among providers that offer the needed level of care, that participate in Medicare, Medicaid or managed care program needed by the patient, have an available bed and are willing to accept the patient.  10/03/2013  Patient/family informed of [facility]' ownership interest in [facility], as well as of the fact that they are under no obligation to receive care at this facility.  PASARR submitted to [employer] on existing PASARR number received from [employer] on   FL2 transmitted to all facilities in geographic area requested by pt/family on  10/03/2013 FL2 transmitted to all facilities within larger geographic area on   Patient informed that his/her managed care company has contracts with or will negotiate with  certain facilities, including the following:     Patient/family informed of bed offers received:   Patient chooses bed at  Physician recommends and patient chooses bed at    Patient to be transferred to  on   Patient to be transferred to facility by   The following physician request were entered in Epic:   Additional Comments:  Providence Crosby, Theresia Majors Clinical Social Work [phone]

## 2013-10-04 ENCOUNTER — Encounter (HOSPITAL_COMMUNITY): Payer: Self-pay | Admitting: Orthopedic Surgery

## 2013-10-04 LAB — GLUCOSE, CAPILLARY
Glucose-Capillary: 273 mg/dL — ABNORMAL HIGH (ref 70–99)
Glucose-Capillary: 180 mg/dL — ABNORMAL HIGH (ref 70–99)
Glucose-Capillary: 144 mg/dL — ABNORMAL HIGH (ref 70–99)

## 2013-10-04 LAB — CBC
HCT: 23.5 % — ABNORMAL LOW (ref 36.0–46.0)
MCHC: 34.5 g/dL (ref 30.0–36.0)
MCV: 85.1 fL (ref 78.0–100.0)
Platelets: 161 10*3/uL (ref 150–400)
RBC: 2.76 MIL/uL — ABNORMAL LOW (ref 3.87–5.11)
RDW: 13.1 % (ref 11.5–15.5)
WBC: 9 10*3/uL (ref 4.0–10.5)

## 2013-10-04 MED ORDER — HYDROCODONE-ACETAMINOPHEN 5-325 MG PO TABS: 1.0000 | ORAL_TABLET | Freq: Four times a day (QID) | ORAL | Status: DC | PRN

## 2013-10-04 MED ORDER — DABIGATRAN ETEXILATE MESYLATE 150 MG PO CAPS
150.0000 mg | ORAL_CAPSULE | Freq: Every day | ORAL | Status: DC
  Administered 2013-10-04 – 2013-10-05 (×2): 150 mg via ORAL
  Filled 2013-10-04 (×2): qty 1

## 2013-10-04 NOTE — Discharge Summary (Signed)
Physician Discharge Summary  Dawn Bradshaw AOZ:[id number] DOB: 11-16-1927 DOA: 10/01/2013  PCP: Garlan Fillers, MD  Admit date: 10/01/2013 Discharge date: 10/04/2013  Time spent: 45 minutes  Recommendations for Outpatient Follow-up:  -Will be discharged to SNF today for port-hip fracture rehab. -Needs to follow up with Dr. Turner Daniels in 2 weeks as scheduled by him. -Should follow up with PCP in 2 weeks for continued evaluation of a thyroid mass.   Discharge Diagnoses:  Principal Problem:   Closed right hip fracture Active Problems:   Atrial fibrillation   Hyperlipidemia   Dementia   Hypothyroidism   Thyroid mass   Discharge Condition: Stable and improved  Filed Weights   10/01/13 1535 10/01/13 2327  Weight: 70.3 kg (154 lb 15.7 oz) 77.2 kg (170 lb 3.1 oz)    History of present illness:  Patient is an 79 y/o woman with PMH significant for dementia, hypothyroidism, a fib on chronic anticoagulation with pradaxa who presents today following a fall at home. History is provided by the son with whom she lives with as patient cannot give detailed account of events given her dementia. Son states he sat her at the dining room table for breakfast, when she was done he took the plate into the kitchen. He states that he had his back turned towards her for maybe 5 seconds when he turned around and saw her grabbing onto the chair for support as she fell onto the hardwood floors, falling onto her right side and complaining of immediate right-sided hip pain. Xrays show a comminuted displaced oblique fracture of the proximal right femoral  Diaphysis. Dr. Turner Daniels has been consulted and is planning on taking her to the OR today. We were asked to admit her for further evaluation and management.   Hospital Course:   Right Hip Fracture  -s/p repair.  -As per ortho.  -To SNF today.  Atrial Fibrillation  -Rate controlled.  -Pradaxa will be resumed today.  Hypothyroidism  -Continue synthroid.    Dementia  -At baseline.  -Continue aricept.   DM -Well controlled while in the hospital.  Thyroid Mass  -Can follow up as an OP with a thyroid US.   Procedures:  Intramedullary Right Femoral nail.   Consultations:  Dr. Turner Daniels, orthopaedics.   Discharge Instructions  Discharge Orders   Future Orders Complete By Expires   Diet - low sodium heart healthy  As directed    Discontinue IV  As directed    Increase activity slowly  As directed    Partial weight bearing  As directed    Comments:     Status post right hip and femur fracture 10/01/2013. IM rod placed patient will be 50% weightbearing for the next 6-8 weeks, should have physical therapy were or nurse with her when ambulating, has moderate Alzheimer's disease with multiple falls.   Questions:     % Body Weight:     Laterality:     Extremity:         Medication List    STOP taking these medications       LORazepam 1 MG tablet  Commonly known as:  ATIVAN      TAKE these medications       CALTRATE 600 PLUS-VIT D PO  Take 600 mg by mouth daily.     citalopram 10 MG tablet  Commonly known as:  CELEXA  Take 10 mg by mouth at bedtime.     donepezil 10 MG tablet  Commonly known as:  ARICEPT  Take 10 mg by mouth daily.     HYDROcodone-acetaminophen 5-325 MG per tablet  Commonly known as:  NORCO/VICODIN  Take 1-2 tablets by mouth every 6 (six) hours as needed for moderate pain.     losartan-hydrochlorothiazide 100-12.5 MG per tablet  Commonly known as:  HYZAAR  Take 0.5 tablets by mouth daily.     oxyCODONE-acetaminophen 5-325 MG per tablet  Commonly known as:  ROXICET  Take 1 tablet by mouth every 4 (four) hours as needed for severe pain.     PRADAXA 150 MG Caps capsule  Generic drug:  dabigatran  Take 150 mg by mouth daily.     SANCTURA XR 60 MG Cp24  Generic drug:  Trospium Chloride  Take 60 mg by mouth daily.     SYNTHROID 25 MCG tablet  Generic drug:  levothyroxine  Take 12.5 mcg by  mouth daily.       No Known Allergies     Follow-up Information   Follow up with Nestor Lewandowsky, MD In 2 weeks.   Specialty:  Orthopedic Surgery   Contact information:   [address] [place] [address] [phone]        The results of significant diagnostics from this hospitalization (including imaging, microbiology, ancillary and laboratory) are listed below for reference.    Significant Diagnostic Studies: Dg Chest 1 View  10/01/2013   CLINICAL DATA:  Preop for right femur fracture fixation.  EXAM: CHEST - 1 VIEW  COMPARISON:  09/10/2013  FINDINGS: Cardiac silhouette is mildly enlarged. The aorta is mildly uncoiled and tortuous. No mediastinal or hilar masses are appreciated.  There is vascular congestion without overt pulmonary edema. This is accentuated by relatively low lung volumes. No focal consolidation. No pleural effusion or pneumothorax. Minor left lung base atelectasis is stable from the prior exam.  The bony thorax is demineralized but grossly intact.  IMPRESSION: No acute cardiopulmonary disease.   Electronically Signed   By: Amie Portland M.D.   On: 10/01/2013 11:17   Dg Hip Complete Right  10/01/2013   CLINICAL DATA:  Fall with right hip and leg pain.  EXAM: RIGHT HIP - COMPLETE 2+ VIEW  COMPARISON:  01/19/2008.  FINDINGS: There is a comminuted oblique fracture of the proximal right femoral diaphysis which involves the lesser trochanter. The lesser trochanteric fragment is slightly medially displaced. There is 2.2 cm of posterior displacement of the major distal fracture fragment displacement. There is 17 mm of medial displacement of the distal fracture fragment. There is no hip fracture or dislocation.  There is orthopedic hardware transfixing a old healed left proximal femoral fracture. There is generalized osteopenia. There are mild degenerative changes of bilateral SI joints. There are degenerative changes of the visualized lower lumbar spine.  IMPRESSION: Comminuted  and displaced oblique fracture of the proximal right femoral diaphysis extending into the lesser trochanter.   Electronically Signed   By: Elige Ko   On: 10/01/2013 11:11   Dg Femur Right  10/01/2013   CLINICAL DATA:  Internal fixation  EXAM: RIGHT FEMUR - 2 VIEW; DG C-ARM 61-120 MIN - NRPT [facility]  COMPARISON:  Earlier films of the same day  FINDINGS: Interval placement of IM rod with 2 cerclage wires, distal interlocking screw, and interlocking sliding screw in the neck across the proximal femoral fracture, fragments in near anatomic alignment. The lesser trochanter fragment remains displaced medially.  IMPRESSION: Internal fixation of proximal femoral fracture.   Electronically Signed   By: Kerry Kass.D.  On: 10/01/2013 20:58   Dg Femur Right  10/01/2013   CLINICAL DATA:  Fall this morning. Severe right hip and leg pain.  EXAM: RIGHT FEMUR - 2 VIEW  COMPARISON:  Right hip x-ray of the same date.  FINDINGS: Comminuted oblique fracture of the proximal right femoral diaphysis is noted. This involves the lesser trochanter. Lesser trochanter fragment is displaced medially. There is posterior and superior displacement of the distal fracture fragment. Posterior displacement is approximately 2.5 cm. Superior displacement is approximately 13 cm.  IMPRESSION: Comminuted displaced oblique fracture of the proximal right femoral diaphysis. This involves the lesser trochanter.   Electronically Signed   By: Jerene Dilling M.D.   On: 10/01/2013 11:18   Ct Head Wo Contrast  10/01/2013   CLINICAL DATA:  Post fall this morning, no loss of consciousness are head injury  EXAM: CT HEAD WITHOUT CONTRAST  CT CERVICAL SPINE WITHOUT CONTRAST  TECHNIQUE: Multidetector CT imaging of the head and cervical spine was performed following the standard protocol without intravenous contrast. Multiplanar CT image reconstructions of the cervical spine were also generated.  COMPARISON:  Head CT -09/10/2013; 02/20/2012   FINDINGS: CT HEAD FINDINGS  Redemonstrated advanced atrophy with sulcal prominence and centralized volume loss with commensurate mild ex vacuo dilatation of the ventricular system. Unchanged lacunar infarcts within the periventricular white matter about the anterior horn of the right lateral ventricle and within the left cerebellum (image 9, series 4). Rather extensive periventricular hypodensities are grossly unchanged compatible with microvascular ischemic disease. Given back from parenchymal abnormalities, there is no CT evidence for acute large territory infarct. No intraparenchymal extra-axial mass or hemorrhage. Unchanged size and configuration of the ventricles and basilar cisterns. No midline shift. Intracranial atherosclerosis. Re demonstrated under pneumatization of the bilateral mastoid air cells. Remaining paranasal sinuses are normally aerated. Regional soft tissues are normal. Mild hyperostosis frontalis. No displaced calvarial fracture. Post bilateral cataract surgery.  CT CERVICAL SPINE FINDINGS  C1 to the inferior endplate of T2 is imaged.  Mild scoliotic curvature of the cervical spine, convex to the left, possibly positional. No anterolisthesis or retrolisthesis. The bilateral facets are normally aligned. The dens is normally positioned between the lateral masses of C1. Mild degenerative change of the atlantodental articulation. Normal atlantoaxial articulations.  No fracture or static subluxation of the cervical spine. Cervical vertebral body heights are preserved. Prevertebral soft tissues are normal.  There is mild to moderate multilevel cervical spine DDD, likely worse at C5-C6 and C6-C7 with disk space height loss, endplate irregularity and small posteriorly directed disk osteophyte complexes at these levels.  Calcifications within the bilateral carotid bulb. There is an exophytic at least 5.9 x 4.8 cm mixed attenuating mass which appears to arise from the caudal aspect of the right lobe of  the thyroid it results in mild leftward deviation of the tracheal air column (image 72, series 5) - note, though the caudal extent of this mass is not imaged on this examination. There is an additional approximately 1.7 x 1.5 cm nodule within the caudal aspect of the left lobe of the thyroid. No bulky cervical lymphadenopathy on this noncontrast examination.  Limited visualization of lung apices is normal.  IMPRESSION: 1. No acute intracranial process. 2. Stable findings of advanced atrophy and microvascular ischemic disease. 3. No fracture or static subluxation of the cervical spine. 4. Mild to moderate multilevel cervical spine DDD. 5. Exophytic at least 5.9 cm mass appears to arise from the caudal aspect of the right lobe of the  thyroid. There is an additional approximately 1.7 cm nodule within the caudal aspect of the left lobe of the thyroid. Further evaluation with nonemergent thyroid ultrasound may be performed as clinically indicated. 6. Calcifications within the bilateral carotid bulbs. Further evaluation with nonemergent carotid Doppler ultrasound may be performed as clinically indicated.   Electronically Signed   By: Simonne Come M.D.   On: 10/01/2013 12:05   Ct Cervical Spine Wo Contrast  10/01/2013   CLINICAL DATA:  Post fall this morning, no loss of consciousness are head injury  EXAM: CT HEAD WITHOUT CONTRAST  CT CERVICAL SPINE WITHOUT CONTRAST  TECHNIQUE: Multidetector CT imaging of the head and cervical spine was performed following the standard protocol without intravenous contrast. Multiplanar CT image reconstructions of the cervical spine were also generated.  COMPARISON:  Head CT -09/10/2013; 02/20/2012  FINDINGS: CT HEAD FINDINGS  Redemonstrated advanced atrophy with sulcal prominence and centralized volume loss with commensurate mild ex vacuo dilatation of the ventricular system. Unchanged lacunar infarcts within the periventricular white matter about the anterior horn of the right lateral  ventricle and within the left cerebellum (image 9, series 4). Rather extensive periventricular hypodensities are grossly unchanged compatible with microvascular ischemic disease. Given back from parenchymal abnormalities, there is no CT evidence for acute large territory infarct. No intraparenchymal extra-axial mass or hemorrhage. Unchanged size and configuration of the ventricles and basilar cisterns. No midline shift. Intracranial atherosclerosis. Re demonstrated under pneumatization of the bilateral mastoid air cells. Remaining paranasal sinuses are normally aerated. Regional soft tissues are normal. Mild hyperostosis frontalis. No displaced calvarial fracture. Post bilateral cataract surgery.  CT CERVICAL SPINE FINDINGS  C1 to the inferior endplate of T2 is imaged.  Mild scoliotic curvature of the cervical spine, convex to the left, possibly positional. No anterolisthesis or retrolisthesis. The bilateral facets are normally aligned. The dens is normally positioned between the lateral masses of C1. Mild degenerative change of the atlantodental articulation. Normal atlantoaxial articulations.  No fracture or static subluxation of the cervical spine. Cervical vertebral body heights are preserved. Prevertebral soft tissues are normal.  There is mild to moderate multilevel cervical spine DDD, likely worse at C5-C6 and C6-C7 with disk space height loss, endplate irregularity and small posteriorly directed disk osteophyte complexes at these levels.  Calcifications within the bilateral carotid bulb. There is an exophytic at least 5.9 x 4.8 cm mixed attenuating mass which appears to arise from the caudal aspect of the right lobe of the thyroid it results in mild leftward deviation of the tracheal air column (image 72, series 5) - note, though the caudal extent of this mass is not imaged on this examination. There is an additional approximately 1.7 x 1.5 cm nodule within the caudal aspect of the left lobe of the thyroid.  No bulky cervical lymphadenopathy on this noncontrast examination.  Limited visualization of lung apices is normal.  IMPRESSION: 1. No acute intracranial process. 2. Stable findings of advanced atrophy and microvascular ischemic disease. 3. No fracture or static subluxation of the cervical spine. 4. Mild to moderate multilevel cervical spine DDD. 5. Exophytic at least 5.9 cm mass appears to arise from the caudal aspect of the right lobe of the thyroid. There is an additional approximately 1.7 cm nodule within the caudal aspect of the left lobe of the thyroid. Further evaluation with nonemergent thyroid ultrasound may be performed as clinically indicated. 6. Calcifications within the bilateral carotid bulbs. Further evaluation with nonemergent carotid Doppler ultrasound may be performed as clinically indicated.  Electronically Signed   By: Simonne Come M.D.   On: 10/01/2013 12:05   Dg C-arm 61-120 Min-no Report  10/01/2013   CLINICAL DATA:  Internal fixation  EXAM: RIGHT FEMUR - 2 VIEW; DG C-ARM 61-120 MIN - [facility]PT [facility]  COMPARISON:  Earlier films of the same day  FINDINGS: Interval placement of IM rod with 2 cerclage wires, distal interlocking screw, and interlocking sliding screw in the neck across the proximal femoral fracture, fragments in near anatomic alignment. The lesser trochanter fragment remains displaced medially.  IMPRESSION: Internal fixation of proximal femoral fracture.   Electronically Signed   By: Oley Balm M.D.   On: 10/01/2013 20:58    Microbiology: Recent Results (from the past 240 hour(s))  SURGICAL PCR SCREEN     Status: None   Collection Time    10/01/13  3:52 PM      Result Value Range Status   MRSA, PCR NEGATIVE  NEGATIVE Final   Staphylococcus aureus NEGATIVE  NEGATIVE Final   Comment:            The Xpert SA Assay (FDA     approved for NASAL specimens     in patients over 15 years of age),     is one component of     a comprehensive surveillance     program.  Test  performance has     been validated by [employer] for patients greater     than or equal to 20 year old.     It is not intended     to diagnose infection nor to     guide or monitor treatment.     Labs: Basic Metabolic Panel:  Recent Labs Lab 10/01/13 1159 10/02/13 0340 10/03/13 0412  NA 135 133* 131*  K 3.5 3.9 3.6  CL 98 97 96  CO2 27 25 28   GLUCOSE 219* 195* 175*  BUN 17 18 13   CREATININE 0.90 0.89 0.84  CALCIUM 9.5 9.0 8.7   Liver Function Tests:  Recent Labs Lab 10/01/13 1159  AST 20  ALT 15  ALKPHOS 65  BILITOT 0.4  PROT 6.1  ALBUMIN 3.1*   No results found for this basename: LIPASE, AMYLASE,  in the last 168 hours No results found for this basename: AMMONIA,  in the last 168 hours CBC:  Recent Labs Lab 10/01/13 1159 10/02/13 0340 10/03/13 0412 10/04/13 0445  WBC 8.5 6.9 8.3 9.0  NEUTROABS 6.9  --   --   --   HGB 13.4 9.9* 9.2* 8.1*  HCT 39.5 29.6* 27.5* 23.5*  MCV 87.8 87.8 87.0 85.1  PLT 224 183 167 161   Cardiac Enzymes:  Recent Labs Lab 10/01/13 1159  TROPONINI <0.30   BNP: BNP (last 3 results) No results found for this basename: PROBNP,  in the last 8760 hours CBG:  Recent Labs Lab 10/01/13 2339 10/02/13 1233 10/02/13 1647 10/02/13 2123 10/03/13 0733  GLUCAP 146* 162* 208* 193* 165*       Signed:  HERNANDEZ ACOSTA,ESTELA  [facility] Pager: [phone] 10/04/2013, 10:36 AM

## 2013-10-04 NOTE — Progress Notes (Signed)
CSW assisting with d/c planning. Pt has SNF bed at [facility]. for today pending [facility] authorization. SNF has begun authorization process . CSW will assist as needed.   Cori Razor LCSW [phone]

## 2013-10-04 NOTE — Progress Notes (Signed)
I came by this AM to see Dawn Bradshaw progress. She was sitting up and eating breakfast with no family at bedside. She was comfortable as long as she didn't move her right leg. She is only oriented to self. Gross motor exam: equal full strength in lower extremities. Limited by her pain. She is doing well all things considered. Her family has my personal phone number and is welcome to call me at any time to discuss her care and my involvement with her care.

## 2013-10-04 NOTE — Progress Notes (Signed)
Physical Therapy Treatment Patient Details Name: Dawn Bradshaw MRN: [id number] DOB: 10-10-1927 Today's Date: 10/04/2013 Time: 9562-1308 PT Time Calculation (min): 12 min  PT Assessment / Plan / Recommendation  History of Present Illness     PT Comments   Pt participating but requiring constant encouragement and repetition of cues 2* memory issues related to dementia.  Follow Up Recommendations  SNF     Does the patient have the potential to tolerate intense rehabilitation     Barriers to Discharge        Equipment Recommendations  Wheelchair (measurements PT);Wheelchair cushion (measurements PT)    Recommendations for Other Services OT consult  Frequency Min 3X/week   Progress towards PT Goals Progress towards PT goals: Progressing toward goals  Plan Current plan remains appropriate    Precautions / Restrictions Precautions Precautions: Fall Restrictions Weight Bearing Restrictions: Yes RLE Weight Bearing: Partial weight bearing RLE Partial Weight Bearing Percentage or Pounds: 50   Pertinent Vitals/Pain That leg sure hurts    Mobility  Bed Mobility Bed Mobility: Not assessed (Deferred - pt requiring bedpan) Transfers Transfers: Not assessed Ambulation/Gait Ambulation/Gait Assistance: Not tested (comment)    Exercises General Exercises - Lower Extremity Ankle Circles/Pumps: AROM;AAROM;Both;20 reps;Supine Heel Slides: AAROM;15 reps;Supine;Right Hip ABduction/ADduction: AAROM;Right;15 reps;Supine   PT Diagnosis:    PT Problem List:   PT Treatment Interventions:     PT Goals (current goals can now be found in the care plan section) Acute Rehab PT Goals Patient Stated Goal: Pt unable 2* cognition PT Goal Formulation: Patient unable to participate in goal setting Time For Goal Achievement: 10/09/13 Potential to Achieve Goals: Fair  Visit Information  Last PT Received On: 10/04/13 Assistance Needed: +2    Subjective Data  Subjective: This hip sure hurts   Patient Stated Goal: Pt unable 2* cognition   Cognition  Cognition Arousal/Alertness: Awake/alert Behavior During Therapy: WFL for tasks assessed/performed Overall Cognitive Status: History of cognitive impairments - at baseline Memory: Decreased short-term memory    Balance     End of Session PT - End of Session Activity Tolerance: Patient tolerated treatment well Patient left: in bed;with call bell/phone within reach;with family/visitor present Nurse Communication: Mobility status   GP     Philip Eckersley 10/04/2013, 6:02 PM

## 2013-10-04 NOTE — Progress Notes (Signed)
Physical Therapy Treatment Patient Details Name: Dawn Bradshaw MRN: [id number] DOB: 1927-04-10 Today's Date: 10/04/2013 Time: 7829-5621 PT Time Calculation (min): 16 min  PT Assessment / Plan / Recommendation  History of Present Illness     PT Comments   Pt continues to require constant and repeated cues for task participation 2* dementia.  Follow Up Recommendations  SNF     Does the patient have the potential to tolerate intense rehabilitation     Barriers to Discharge        Equipment Recommendations  Wheelchair (measurements PT);Wheelchair cushion (measurements PT)    Recommendations for Other Services OT consult  Frequency Min 3X/week   Progress towards PT Goals Progress towards PT goals: Progressing toward goals  Plan Current plan remains appropriate    Precautions / Restrictions Precautions Precautions: Fall Restrictions Weight Bearing Restrictions: Yes RLE Weight Bearing: Partial weight bearing RLE Partial Weight Bearing Percentage or Pounds: 50       Mobility  Bed Mobility Bed Mobility: Supine to Sit Supine to Sit: 1: +2 Total assist;With rails;HOB elevated Supine to Sit: Patient Percentage: 30% Details for Bed Mobility Assistance: Constant cues for sequencing and use of L LE to self assist.  Pt participating and attempting to follow Transfers Transfers: Sit to Stand;Stand to Sit Sit to Stand: 1: +2 Total assist;From bed;From elevated surface Sit to Stand: Patient Percentage: 50% Stand to Sit: 1: +2 Total assist;To chair/3-in-1;With armrests;With upper extremity assist Stand to Sit: Patient Percentage: 50% Details for Transfer Assistance: cues for LE management and use of UEs to self assist Ambulation/Gait Ambulation/Gait Assistance: 1: +2 Total assist Ambulation/Gait: Patient Percentage: 60% Ambulation Distance (Feet): 3 Feet Assistive device: Rolling walker Ambulation/Gait Assistance Details: Constant step-by-step cues for posture, sequence, stride  length, increased WB on UEs, and position from RW Gait Pattern: Step-to pattern;Decreased step length - right;Decreased step length - left;Decreased stance time - right;Antalgic;Shuffle;Trunk flexed General Gait Details: Pt with difficulty grasping PWB and increased UE WB    Exercises General Exercises - Lower Extremity Ankle Circles/Pumps: AROM;AAROM;Both;20 reps;Supine Heel Slides: AAROM;15 reps;Supine;Right Hip ABduction/ADduction: AAROM;Right;15 reps;Supine   PT Diagnosis:    PT Problem List:   PT Treatment Interventions:     PT Goals (current goals can now be found in the care plan section) Acute Rehab PT Goals Patient Stated Goal: Pt unable 2* cognition PT Goal Formulation: Patient unable to participate in goal setting Time For Goal Achievement: 10/09/13 Potential to Achieve Goals: Fair  Visit Information  Last PT Received On: 10/04/13 Assistance Needed: +2    Subjective Data  Subjective: This hip sure hurts  Patient Stated Goal: Pt unable 2* cognition   Cognition  Cognition Arousal/Alertness: Awake/alert Behavior During Therapy: WFL for tasks assessed/performed Overall Cognitive Status: History of cognitive impairments - at baseline Memory: Decreased short-term memory    Balance     End of Session PT - End of Session Equipment Utilized During Treatment: Gait belt Activity Tolerance: Patient tolerated treatment well Patient left: in chair;with call bell/phone within reach;with family/visitor present Nurse Communication: Mobility status   GP     Climmie Cronce 10/04/2013, 6:09 PM

## 2013-10-04 NOTE — Progress Notes (Signed)
CSW assisting with d/c planning. [facility] contacted. SNF continues to work with [facility] for authorization. No authorization provided at this time. SNF will contact CSW when they have received approval. NSG aware and will alert pt/famil/MD.  Cori Razor LCSW [phone]

## 2013-10-04 NOTE — Progress Notes (Signed)
PATIENT ID: Dawn Bradshaw  MRN: [id number]  DOB/AGE:  [birth year] / 79 y.o.  3 Days Post-Op Procedure(s) (LRB): INTRAMEDULLARY (IM) NAIL FEMORAL (Right)    PROGRESS NOTE Subjective: Patient is alert,no Nausea, no Vomiting, yes passing gas, no Bowel Movement. Taking PO well, pt eating breakfast in bed. Denies SOB, Chest or Calf Pain. Using Incentive Spirometer, PAS in place. Ambulate 50 %  Patient reports pain as minimal at rest, but increases with movment.    Objective: Vital signs in last 24 hours: Filed Vitals:   10/03/13 2000 10/03/13 2156 10/04/13 0000 10/04/13 0521  BP:  124/80  122/78  Pulse:  94  87  Temp:  98.6 F (37 C)  98.7 F (37.1 C)  TempSrc:  Oral  Oral  Resp: 15 16 15 14   Height:      Weight:      SpO2:  95%  96%      Intake/Output from previous day: I/O last 3 completed shifts: In: 1140 [P.O.:660; I.V.:480] Out: 3100 [Urine:3100]   Intake/Output this shift: Total I/O In: 240 [P.O.:240] Out: -    LABORATORY DATA:  Recent Labs  10/01/13 1159  10/02/13 0340  10/02/13 1647 10/02/13 2123 10/03/13 0412 10/03/13 0733 10/04/13 0445  WBC 8.5  --  6.9  --   --   --  8.3  --  9.0  HGB 13.4  --  9.9*  --   --   --  9.2*  --  8.1*  HCT 39.5  --  29.6*  --   --   --  27.5*  --  23.5*  PLT 224  --  183  --   --   --  167  --  161  NA 135  --  133*  --   --   --  131*  --   --   K 3.5  --  3.9  --   --   --  3.6  --   --   CL 98  --  97  --   --   --  96  --   --   CO2 27  --  25  --   --   --  28  --   --   BUN 17  --  18  --   --   --  13  --   --   CREATININE 0.90  --  0.89  --   --   --  0.84  --   --   GLUCOSE 219*  --  195*  --   --   --  175*  --   --   GLUCAP  --   < >  --   < > 208* 193*  --  165*  --   INR 1.12  --   --   --   --   --   --   --   --   CALCIUM 9.5  --  9.0  --   --   --  8.7  --   --   < > = values in this interval not displayed.  Examination: Neurologically intact ABD soft Neurovascular intact Sensation intact  distally Intact pulses distally Dorsiflexion/Plantar flexion intact Incision: no drainage No cellulitis present Compartment soft} XR AP&Lat of hip shows well placed\fixed THA  Assessment:   3 Days Post-Op Procedure(s) (LRB): INTRAMEDULLARY (IM) NAIL FEMORAL (Right) ADDITIONAL DIAGNOSIS:  Atrial fibrillation, moderate Alzheimer's disease, hypothyroidism, mitral regurgitation  Plan: PT/OT WBAT, THA  posterior precautions  DVT Prophylaxis: SCDx72 hrs, plan to restart Pradaxa today  DISCHARGE PLAN: Skilled Nursing Facility/Rehab when cleared by medicine  DISCHARGE NEEDS: HHPT, HHRN, Walker and 3-in-1 comode seat

## 2013-10-04 NOTE — Progress Notes (Signed)
Physical Therapy Treatment Patient Details Name: Dawn Bradshaw MRN: [id number] DOB: 10-Jun-1927 Today's Date: 10/04/2013 Time: 1725-1750 PT Time Calculation (min): 25 min  PT Assessment / Plan / Recommendation  History of Present Illness     PT Comments   Pt continues pleasant but highly distractible and requiring constant multimodal cues to focus on and complete task.  Pt continues to have difficulty grasping PWB and need for increased UE WB with transfers and gait.  Follow Up Recommendations  SNF     Does the patient have the potential to tolerate intense rehabilitation     Barriers to Discharge        Equipment Recommendations  Wheelchair (measurements PT);Wheelchair cushion (measurements PT)    Recommendations for Other Services OT consult  Frequency Min 3X/week   Progress towards PT Goals Progress towards PT goals: Progressing toward goals  Plan Current plan remains appropriate    Precautions / Restrictions Precautions Precautions: Fall Restrictions Weight Bearing Restrictions: Yes RLE Weight Bearing: Partial weight bearing RLE Partial Weight Bearing Percentage or Pounds: 50       Mobility  Bed Mobility Bed Mobility: Sit to Supine Supine to Sit: 1: +2 Total assist;With rails;HOB elevated Supine to Sit: Patient Percentage: 30% Sit to Supine: 1: +2 Total assist;HOB flat Sit to Supine: Patient Percentage: 10% Details for Bed Mobility Assistance: Pt having difficulty following comprehending cues to assist with return to bed Transfers Transfers: Sit to Stand;Stand to Sit Sit to Stand: 1: +2 Total assist;From chair/3-in-1;With upper extremity assist Sit to Stand: Patient Percentage: 60% Stand to Sit: 1: +2 Total assist;To chair/3-in-1;With armrests;With upper extremity assist;To bed Stand to Sit: Patient Percentage: 50% Details for Transfer Assistance: cues for LE management and use of UEs to self assist Ambulation/Gait Ambulation/Gait Assistance: 1: +2 Total  assist Ambulation/Gait: Patient Percentage: 60% Ambulation Distance (Feet): 3 Feet (twice) Assistive device: Rolling walker Ambulation/Gait Assistance Details: constant cues for sequence, foot placement, position from RW, PWB, increased UE WB Gait Pattern: Step-to pattern;Decreased step length - right;Decreased step length - left;Decreased stance time - right;Antalgic;Shuffle;Trunk flexed General Gait Details: Pt with difficulty grasping PWB and increased UE WB    Exercises General Exercises - Lower Extremity Ankle Circles/Pumps: AROM;AAROM;Both;20 reps;Supine Heel Slides: AAROM;15 reps;Supine;Right Hip ABduction/ADduction: AAROM;Right;15 reps;Supine   PT Diagnosis:    PT Problem List:   PT Treatment Interventions:     PT Goals (current goals can now be found in the care plan section) Acute Rehab PT Goals Patient Stated Goal: Pt unable 2* cognition PT Goal Formulation: Patient unable to participate in goal setting Time For Goal Achievement: 10/09/13 Potential to Achieve Goals: Fair  Visit Information  Last PT Received On: 10/04/13 Assistance Needed: +2    Subjective Data  Subjective: This hip sure hurts  Patient Stated Goal: Pt unable 2* cognition   Cognition  Cognition Arousal/Alertness: Awake/alert Behavior During Therapy: WFL for tasks assessed/performed Overall Cognitive Status: History of cognitive impairments - at baseline Memory: Decreased short-term memory    Balance     End of Session PT - End of Session Equipment Utilized During Treatment: Gait belt Activity Tolerance: Patient tolerated treatment well Patient left: in bed;with call bell/phone within reach Nurse Communication: Mobility status   GP     Timmy Bubeck 10/04/2013, 6:14 PM

## 2013-10-05 LAB — GLUCOSE, CAPILLARY: Glucose-Capillary: 191 mg/dL — ABNORMAL HIGH (ref 70–99)

## 2013-10-05 NOTE — Progress Notes (Signed)
Pt seen and examined at bedside. Pt is clinically stable this AM. Please see discharge summary done by Dr. Ardyth Harps November 10th, 2014.   Debbora Presto, MD  [facility] Pager [phone]  If 7PM-7AM, please contact night-coverage [email or web address] Password [email or web address]

## 2013-10-05 NOTE — Progress Notes (Signed)
[employer] insurance has provided authorization for sNF placement. Pt / family are aware. [facility] has been contacted for transport to [facility] has been arranged.  Cori Razor LCSW [phone]

## 2013-10-05 NOTE — Progress Notes (Signed)
Clinical Social Work Department BRIEF PSYCHOSOCIAL ASSESSMENT 10/05/2013  Patient:  Dawn Bradshaw     Account Number:  [id number]     Admit date:  10/04/2013  Clinical Social Worker:  Candie Chroman  Date/Time:  10/05/2013 02:43 PM  Referred by:  Physician  Date Referred:  10/05/2013 Referred for  SNF Placement   Other Referral:   Interview type:  Patient Other interview type:    PSYCHOSOCIAL DATA Living Status:  ALONE Admitted from facility:   Level of care:   Primary support name:  Kathie Rhodes [place] Primary support relationship to patient:   Degree of support available:   unclear    CURRENT CONCERNS Current Concerns  Post-Acute Placement   Other Concerns:    SOCIAL WORK ASSESSMENT / PLAN Pt is an 79 yr old gentlaman living at home prior to hospitalization. CSW met with pt and caregiver to assist with d/c planning. Pt would like to go home with 24/7 assistance following hospital d/c. Caregiver feels she may be able to manage pt's care but will wait to see how pt does WED with PT. At this time, PT is recommending SNF placement. CSW will meet again with pt / caregiver in the am to continue assisting with d/c planning.   Assessment/plan status:  Psychosocial Support/Ongoing Assessment of Needs Other assessment/ plan:   Home vs SNF   Information/referral to community resources:   SNF list with bed offers will be provided if needed. Insurance coverage for SNF reviewed. Prior authorization is required with [employer].    PATIENT'S/FAMILY'S RESPONSE TO PLAN OF CARE: D/C plans are not finalized. Pt is hoping to return home but will consider [facility] if necessary.   [name] LCSW [phone]

## 2014-01-27 ENCOUNTER — Emergency Department (HOSPITAL_COMMUNITY): Payer: Medicare PPO

## 2014-01-27 ENCOUNTER — Encounter (HOSPITAL_COMMUNITY): Payer: Self-pay | Admitting: Emergency Medicine

## 2014-01-27 ENCOUNTER — Inpatient Hospital Stay (HOSPITAL_COMMUNITY)
Admission: EM | Admit: 2014-01-27 | Discharge: 2014-02-01 | DRG: 516 | Disposition: A | Discharge status: Skilled Nursing Facility | Payer: Medicare PPO | Attending: Internal Medicine | Admitting: Internal Medicine

## 2014-01-27 DIAGNOSIS — E079 Disorder of thyroid, unspecified: Secondary | ICD-10-CM

## 2014-01-27 DIAGNOSIS — F0390 Unspecified dementia without behavioral disturbance: Secondary | ICD-10-CM | POA: Diagnosis present

## 2014-01-27 DIAGNOSIS — I10 Essential (primary) hypertension: Secondary | ICD-10-CM | POA: Diagnosis present

## 2014-01-27 DIAGNOSIS — Z87891 Personal history of nicotine dependence: Secondary | ICD-10-CM

## 2014-01-27 DIAGNOSIS — M545 Low back pain, unspecified: Secondary | ICD-10-CM

## 2014-01-27 DIAGNOSIS — Z8673 Personal history of transient ischemic attack (TIA), and cerebral infarction without residual deficits: Secondary | ICD-10-CM

## 2014-01-27 DIAGNOSIS — D649 Anemia, unspecified: Secondary | ICD-10-CM | POA: Diagnosis present

## 2014-01-27 DIAGNOSIS — I340 Nonrheumatic mitral (valve) insufficiency: Secondary | ICD-10-CM

## 2014-01-27 DIAGNOSIS — G309 Alzheimer's disease, unspecified: Secondary | ICD-10-CM | POA: Diagnosis present

## 2014-01-27 DIAGNOSIS — Z79899 Other long term (current) drug therapy: Secondary | ICD-10-CM

## 2014-01-27 DIAGNOSIS — E785 Hyperlipidemia, unspecified: Secondary | ICD-10-CM

## 2014-01-27 DIAGNOSIS — I4891 Unspecified atrial fibrillation: Secondary | ICD-10-CM | POA: Diagnosis present

## 2014-01-27 DIAGNOSIS — E46 Unspecified protein-calorie malnutrition: Secondary | ICD-10-CM | POA: Diagnosis present

## 2014-01-27 DIAGNOSIS — M899 Disorder of bone, unspecified: Secondary | ICD-10-CM | POA: Diagnosis present

## 2014-01-27 DIAGNOSIS — E119 Type 2 diabetes mellitus without complications: Secondary | ICD-10-CM | POA: Diagnosis present

## 2014-01-27 DIAGNOSIS — L408 Other psoriasis: Secondary | ICD-10-CM | POA: Diagnosis present

## 2014-01-27 DIAGNOSIS — IMO0002 Reserved for concepts with insufficient information to code with codable children: Secondary | ICD-10-CM

## 2014-01-27 DIAGNOSIS — S32009A Unspecified fracture of unspecified lumbar vertebra, initial encounter for closed fracture: Principal | ICD-10-CM | POA: Diagnosis present

## 2014-01-27 DIAGNOSIS — W19XXXA Unspecified fall, initial encounter: Secondary | ICD-10-CM | POA: Diagnosis present

## 2014-01-27 DIAGNOSIS — Y92009 Unspecified place in unspecified non-institutional (private) residence as the place of occurrence of the external cause: Secondary | ICD-10-CM

## 2014-01-27 DIAGNOSIS — S72001A Fracture of unspecified part of neck of right femur, initial encounter for closed fracture: Secondary | ICD-10-CM

## 2014-01-27 DIAGNOSIS — R002 Palpitations: Secondary | ICD-10-CM | POA: Diagnosis present

## 2014-01-27 DIAGNOSIS — N390 Urinary tract infection, site not specified: Secondary | ICD-10-CM | POA: Diagnosis present

## 2014-01-27 DIAGNOSIS — Z8249 Family history of ischemic heart disease and other diseases of the circulatory system: Secondary | ICD-10-CM

## 2014-01-27 DIAGNOSIS — F0280 Dementia in other diseases classified elsewhere without behavioral disturbance: Secondary | ICD-10-CM | POA: Diagnosis present

## 2014-01-27 DIAGNOSIS — S32020A Wedge compression fracture of second lumbar vertebra, initial encounter for closed fracture: Secondary | ICD-10-CM | POA: Diagnosis present

## 2014-01-27 DIAGNOSIS — R2681 Unsteadiness on feet: Secondary | ICD-10-CM | POA: Diagnosis present

## 2014-01-27 DIAGNOSIS — E039 Hypothyroidism, unspecified: Secondary | ICD-10-CM | POA: Diagnosis present

## 2014-01-27 DIAGNOSIS — M949 Disorder of cartilage, unspecified: Secondary | ICD-10-CM

## 2014-01-27 DIAGNOSIS — R269 Unspecified abnormalities of gait and mobility: Secondary | ICD-10-CM | POA: Diagnosis present

## 2014-01-27 LAB — URINALYSIS, ROUTINE W REFLEX MICROSCOPIC
Bilirubin Urine: NEGATIVE
Glucose, UA: NEGATIVE mg/dL
Hgb urine dipstick: NEGATIVE
Ketones, ur: NEGATIVE mg/dL
LEUKOCYTES UA: NEGATIVE
NITRITE: POSITIVE — AB
Protein, ur: NEGATIVE mg/dL
SPECIFIC GRAVITY, URINE: 1.016 (ref 1.005–1.030)
Urobilinogen, UA: 0.2 mg/dL (ref 0.0–1.0)
pH: 7.5 (ref 5.0–8.0)

## 2014-01-27 LAB — CBC
HCT: 38.6 % (ref 36.0–46.0)
Hemoglobin: 13.1 g/dL (ref 12.0–15.0)
MCH: 28.3 pg (ref 26.0–34.0)
MCHC: 33.9 g/dL (ref 30.0–36.0)
MCV: 83.4 fL (ref 78.0–100.0)
Platelets: 213 10*3/uL (ref 150–400)
RBC: 4.63 MIL/uL (ref 3.87–5.11)
RDW: 14.4 % (ref 11.5–15.5)
WBC: 7.2 10*3/uL (ref 4.0–10.5)

## 2014-01-27 LAB — CREATININE, SERUM
Creatinine, Ser: 0.79 mg/dL (ref 0.50–1.10)
GFR calc Af Amer: 86 mL/min — ABNORMAL LOW (ref >90)
GFR calc non Af Amer: 74 mL/min — ABNORMAL LOW (ref >90)

## 2014-01-27 LAB — I-STAT CHEM 8, ED
BUN: 18 mg/dL (ref 6–23)
CALCIUM ION: 1.16 mmol/L (ref 1.13–1.30)
CHLORIDE: 101 meq/L (ref 96–112)
Creatinine, Ser: 0.9 mg/dL (ref 0.50–1.10)
GLUCOSE: 139 mg/dL — AB (ref 70–99)
HCT: 41 % (ref 36.0–46.0)
Hemoglobin: 13.9 g/dL (ref 12.0–15.0)
Potassium: 3.5 mEq/L — ABNORMAL LOW (ref 3.7–5.3)
Sodium: 139 mEq/L (ref 137–147)
TCO2: 27 mmol/L (ref 0–100)

## 2014-01-27 LAB — CBC WITH DIFFERENTIAL/PLATELET
Basophils Absolute: 0 10*3/uL (ref 0.0–0.1)
Basophils Relative: 0 % (ref 0–1)
Eosinophils Absolute: 0 10*3/uL (ref 0.0–0.7)
Eosinophils Relative: 0 % (ref 0–5)
HEMATOCRIT: 39.6 % (ref 36.0–46.0)
HEMOGLOBIN: 13.1 g/dL (ref 12.0–15.0)
LYMPHS PCT: 14 % (ref 12–46)
Lymphs Abs: 1 10*3/uL (ref 0.7–4.0)
MCH: 27.8 pg (ref 26.0–34.0)
MCHC: 33.1 g/dL (ref 30.0–36.0)
MCV: 84.1 fL (ref 78.0–100.0)
MONO ABS: 0.5 10*3/uL (ref 0.1–1.0)
MONOS PCT: 7 % (ref 3–12)
NEUTROS ABS: 5.9 10*3/uL (ref 1.7–7.7)
Neutrophils Relative %: 79 % — ABNORMAL HIGH (ref 43–77)
Platelets: 219 10*3/uL (ref 150–400)
RBC: 4.71 MIL/uL (ref 3.87–5.11)
RDW: 14.4 % (ref 11.5–15.5)
WBC: 7.4 10*3/uL (ref 4.0–10.5)

## 2014-01-27 LAB — URINE MICROSCOPIC-ADD ON

## 2014-01-27 LAB — I-STAT TROPONIN, ED: Troponin i, poc: 0.02 ng/mL (ref 0.00–0.08)

## 2014-01-27 LAB — I-STAT CG4 LACTIC ACID, ED: Lactic Acid, Venous: 1.09 mmol/L (ref 0.5–2.2)

## 2014-01-27 LAB — CBG MONITORING, ED: GLUCOSE-CAPILLARY: 131 mg/dL — AB (ref 70–99)

## 2014-01-27 MED ORDER — DABIGATRAN ETEXILATE MESYLATE 150 MG PO CAPS
150.0000 mg | ORAL_CAPSULE | Freq: Every day | ORAL | Status: DC
  Administered 2014-01-27: 150 mg via ORAL
  Filled 2014-01-27 (×2): qty 1

## 2014-01-27 MED ORDER — BISACODYL 10 MG RE SUPP: 10.0000 mg | Freq: Every day | RECTAL | Status: DC | PRN

## 2014-01-27 MED ORDER — HYDROMORPHONE HCL PF 1 MG/ML IJ SOLN
0.5000 mg | INTRAMUSCULAR | Status: DC | PRN
  Administered 2014-01-27 – 2014-01-28 (×4): 0.5 mg via INTRAVENOUS
  Filled 2014-01-27 (×5): qty 1

## 2014-01-27 MED ORDER — LEVOTHYROXINE SODIUM 25 MCG PO TABS
12.5000 ug | ORAL_TABLET | Freq: Every day | ORAL | Status: DC
Start: 2014-01-28 — End: 2014-02-01
  Administered 2014-01-28 – 2014-02-01 (×5): 12.5 ug via ORAL
  Filled 2014-01-27 (×6): qty 0.5

## 2014-01-27 MED ORDER — OXYBUTYNIN CHLORIDE ER 10 MG PO TB24
10.0000 mg | ORAL_TABLET | Freq: Every day | ORAL | Status: DC
  Administered 2014-01-27 – 2014-02-01 (×6): 10 mg via ORAL
  Filled 2014-01-27 (×6): qty 1

## 2014-01-27 MED ORDER — ONDANSETRON HCL 4 MG/2ML IJ SOLN: 4.0000 mg | Freq: Four times a day (QID) | INTRAMUSCULAR | Status: DC | PRN

## 2014-01-27 MED ORDER — POLYETHYLENE GLYCOL 3350 17 G PO PACK: 17.0000 g | PACK | Freq: Every day | ORAL | Status: DC | PRN

## 2014-01-27 MED ORDER — SENNA 8.6 MG PO TABS
1.0000 | ORAL_TABLET | Freq: Two times a day (BID) | ORAL | Status: DC
  Administered 2014-01-28 – 2014-02-01 (×10): 8.6 mg via ORAL

## 2014-01-27 MED ORDER — SODIUM CHLORIDE 0.9 % IV SOLN
Freq: Once | INTRAVENOUS | Status: AC
  Administered 2014-01-27: 14:00:00 via INTRAVENOUS

## 2014-01-27 MED ORDER — ACETAMINOPHEN 650 MG RE SUPP: 650.0000 mg | Freq: Four times a day (QID) | RECTAL | Status: DC | PRN

## 2014-01-27 MED ORDER — ONDANSETRON HCL 4 MG PO TABS: 4.0000 mg | ORAL_TABLET | Freq: Four times a day (QID) | ORAL | Status: DC | PRN

## 2014-01-27 MED ORDER — CALCIUM CARBONATE 600 MG PO TABS
600.0000 mg | ORAL_TABLET | Freq: Every day | ORAL | Status: DC
  Filled 2014-01-27 (×2): qty 1

## 2014-01-27 MED ORDER — ENOXAPARIN SODIUM 40 MG/0.4ML ~~LOC~~ SOLN: 40.0000 mg | SUBCUTANEOUS | Status: DC

## 2014-01-27 MED ORDER — CITALOPRAM HYDROBROMIDE 10 MG PO TABS
10.0000 mg | ORAL_TABLET | Freq: Every day | ORAL | Status: DC
  Administered 2014-01-27 – 2014-01-31 (×5): 10 mg via ORAL
  Filled 2014-01-27 (×6): qty 1

## 2014-01-27 MED ORDER — ACETAMINOPHEN 325 MG PO TABS
650.0000 mg | ORAL_TABLET | Freq: Once | ORAL | Status: AC
  Administered 2014-01-27: 650 mg via ORAL
  Filled 2014-01-27: qty 2

## 2014-01-27 MED ORDER — ALUM & MAG HYDROXIDE-SIMETH 200-200-20 MG/5ML PO SUSP: 30.0000 mL | Freq: Four times a day (QID) | ORAL | Status: DC | PRN

## 2014-01-27 MED ORDER — DONEPEZIL HCL 10 MG PO TABS
10.0000 mg | ORAL_TABLET | Freq: Every day | ORAL | Status: DC
  Administered 2014-01-27 – 2014-02-01 (×6): 10 mg via ORAL
  Filled 2014-01-27 (×6): qty 1

## 2014-01-27 MED ORDER — METFORMIN HCL 500 MG PO TABS
500.0000 mg | ORAL_TABLET | Freq: Two times a day (BID) | ORAL | Status: DC
  Administered 2014-01-28 – 2014-02-01 (×7): 500 mg via ORAL
  Filled 2014-01-27 (×11): qty 1

## 2014-01-27 MED ORDER — DEXTROSE 5 % IV SOLN
1.0000 g | Freq: Once | INTRAVENOUS | Status: AC
  Administered 2014-01-27: 1 g via INTRAVENOUS
  Filled 2014-01-27: qty 10

## 2014-01-27 MED ORDER — ACETAMINOPHEN 325 MG PO TABS
650.0000 mg | ORAL_TABLET | Freq: Four times a day (QID) | ORAL | Status: DC | PRN
  Administered 2014-01-30 – 2014-02-01 (×3): 650 mg via ORAL
  Filled 2014-01-27 (×4): qty 2

## 2014-01-27 MED ORDER — POTASSIUM CHLORIDE IN NACL 20-0.9 MEQ/L-% IV SOLN
INTRAVENOUS | Status: DC
  Administered 2014-01-27: 21:00:00 via INTRAVENOUS
  Filled 2014-01-27 (×4): qty 1000

## 2014-01-27 NOTE — ED Notes (Signed)
Patient transported to X-ray 

## 2014-01-27 NOTE — Progress Notes (Signed)
Clinical Social Work Department CLINICAL SOCIAL WORK PLACEMENT NOTE 01/27/2014  Patient:  Dawn Bradshaw,[name]  Account Number:  [id number] Admit date:  01/27/2014  Clinical Social Worker:  Unk LightningHOLLY Hailly Fess, LCSW  Date/time:  01/27/2014 01:00 PM  Clinical Social Work is seeking post-discharge placement for this patient at the following level of care:   SKILLED NURSING   (*CSW will update this form in Epic as items are completed)   01/27/2014  [facility]family [facility] with [facility]'s list of facilities offering this level of care within the geographic area requested by the patient (or if unable, by the patient's family).  01/27/2014  Patient/family informed of their freedom to choose among providers that offer the needed level of care, that participate in Medicare, Medicaid or managed care program needed by the patient, have an available bed and are willing to accept the patient.  01/27/2014  Patient/family informed of [facility]' ownership interest in [facility], as well as of the fact that they are under no obligation to receive care at this facility.  PASARR submitted to [employer] on existing # PASARR number received from [employer] on   FL2 transmitted to all facilities in geographic area requested by pt/family on   FL2 transmitted to all facilities within larger geographic area on   Patient informed that his/her managed care company has contracts with or will negotiate with  certain facilities, including the following:     Patient/family informed of bed offers received:   Patient chooses bed at  Physician recommends and patient chooses bed at    Patient to be transferred to  on   Patient to be transferred to facility by   The following physician request were entered in Epic:   Additional Comments:

## 2014-01-27 NOTE — ED Notes (Signed)
Bed: WA03 Expected date: 01/27/14 Expected time: 7:03 AM Means of arrival: Ambulance Comments: Fall, back pain

## 2014-01-27 NOTE — H&P (Signed)
Dawn Bradshaw is an 80 y.o. female.   Chief Complaint: my back hurts HPI:  The patient is an 80 year old Caucasian woman with multiple medical problems who is well-known to me. This past fall she had a fall leading to right hip fracture with open reduction and internal fixation followed by a skilled nursing facility rehabilitation. She returned home last month and initially was doing well. She has 24-hour care at her home by relatives. She has had a reversed sleep and wake cycle, however. Last night, in the early hours of the morning she got up to go to the bathroom and had a fall onto her back with resultant moderate back pain and inability to walk. Generally she uses a walker to ambulate when she remembers to use it. She was brought to the emergency room with a workup showed bacteriuria and an x-ray of the lumbar spine with an L2 compression fracture of indeterminate age. Subsequently, a CT scan of the lumbar spine was done which showed evidence of an acute L2 compression fracture. Attempts were made to have her stand up in a minute with a walker which were unsuccessful because of moderately severe low back pain. She is being admitted for further treatment of her compression fracture and management of her pain. She has not had problems with fever, chills, shortness of breath, cough, dysuria, frequency, nausea, vomiting, abdominal pain, anxiety, or depression. She has moderately severe short-term memory deficits.  Past Medical History  Diagnosis Date  . Hyperlipidemia   . Thyroid disease     hypothyroidism  . Psoriasis   . Osteopenia   . LBP (low back pain)     mild  . Palpitations     H/O  . Diabetes mellitus     Type II  . Alzheimer's disease     mild  . Arrhythmia 4/12    A-Fib @ [facility]  . Atrial fibrillation   . Stroke   . Hypertension      (Not in a hospital admission)  ADDITIONAL HOME MEDICATIONS: No additional home medications  PHYSICIANS INVOLVED IN CARE: Eloise HarmanPaterson (PCP), Turner Danielsowan  (ortho), Danella DeisGruber (dermatology)  Past Surgical History  Procedure Laterality Date  . Cleft palate repair    . Myomectomy  1980  . Colonoscopy  1999  . Orif hip fracture  08/2002  . Cataract extraction  08/2008  . Femur im nail Right 10/01/2013    Procedure: INTRAMEDULLARY (IM) NAIL FEMORAL;  Surgeon: Nestor LewandowskyFrank J Rowan, MD;  Location: [facility];  Service: Orthopedics;  Laterality: Right;    Family History  Problem Relation Age of Onset  . Heart disease Brother      Social History:  reports that she quit smoking about 65 years ago. Her smoking use included Cigarettes. She smoked 0.00 packs per day. She has never used smokeless tobacco. She reports that she does not drink alcohol or use illicit drugs.  Allergies:  Allergies  Allergen Reactions  . Codeine Hives, Itching and Rash     ROS: arthritis, diabetes, heart palpitation, thyroid disease and hyperlipidemia, psoriasis, alzheimer's disease, chronic atrial fibrillation, CVA in 2012, osteopenia, gait ins andtability  PHYSICAL EXAM: Blood pressure 131/74, pulse 85, temperature 98.3 F (36.8 C), temperature source Oral, resp. rate 20, SpO2 98.00%. In patient is a frail who was in no apparent distress while lying at 10 elevation head of bed. HEENT exam was within normal limits, neck was supple without jugular venous distention, chest was clear to auscultation, heart had a slightly irregular rhythm  without significant murmur or gallop, abdomen had normal bowel sounds and no hepatosplenomegaly or tenderness, there was mild tenderness with palpation of the upper lumbar spine area, extremities were without cyanosis, clubbing, or edema and the bilateral pedal pulses were 1+. Neurologic exam: She was alert and oriented x2, cranial nerves II through XII are normal, sensory exam was grossly normal, she had bilateral 4-5 hip flexor strength and normal upper extremity strength. Cerebellar function and gait were not assessed.  Results for orders placed  during the hospital encounter of 01/27/14 (from the past 48 hour(s))  CBG MONITORING, ED     Status: Abnormal   Collection Time    01/27/14  8:04 AM      Result Value Ref Range   Glucose-Capillary 131 (*) 70 - 99 mg/dL  CBC WITH DIFFERENTIAL     Status: Abnormal   Collection Time    01/27/14  9:12 AM      Result Value Ref Range   WBC 7.4  4.0 - 10.5 K/uL   RBC 4.71  3.87 - 5.11 MIL/uL   Hemoglobin 13.1  12.0 - 15.0 g/dL   HCT 16.1  09.6 - 04.5 %   MCV 84.1  78.0 - 100.0 fL   MCH 27.8  26.0 - 34.0 pg   MCHC 33.1  30.0 - 36.0 g/dL   RDW 40.9  81.1 - 91.4 %   Platelets 219  150 - 400 K/uL   Neutrophils Relative % 79 (*) 43 - 77 %   Neutro Abs 5.9  1.7 - 7.7 K/uL   Lymphocytes Relative 14  12 - 46 %   Lymphs Abs 1.0  0.7 - 4.0 K/uL   Monocytes Relative 7  3 - 12 %   Monocytes Absolute 0.5  0.1 - 1.0 K/uL   Eosinophils Relative 0  0 - 5 %   Eosinophils Absolute 0.0  0.0 - 0.7 K/uL   Basophils Relative 0  0 - 1 %   Basophils Absolute 0.0  0.0 - 0.1 K/uL  I-STAT TROPOININ, ED     Status: None   Collection Time    01/27/14  9:16 AM      Result Value Ref Range   Troponin i, poc 0.02  0.00 - 0.08 ng/mL   Comment 3            Comment: Due to the release kinetics of cTnI,     a negative result within the first hours     of the onset of symptoms does not rule out     myocardial infarction with certainty.     If myocardial infarction is still suspected,     repeat the test at appropriate intervals.  I-STAT CHEM 8, ED     Status: Abnormal   Collection Time    01/27/14  9:18 AM      Result Value Ref Range   Sodium 139  137 - 147 mEq/L   Potassium 3.5 (*) 3.7 - 5.3 mEq/L   Chloride 101  96 - 112 mEq/L   BUN 18  6 - 23 mg/dL   Creatinine, Ser 7.82  0.50 - 1.10 mg/dL   Glucose, Bld 956 (*) 70 - 99 mg/dL   Calcium, Ion 2.13  0.86 - 1.30 mmol/L   TCO2 27  0 - 100 mmol/L   Hemoglobin 13.9  12.0 - 15.0 g/dL   HCT 57.8  46.9 - 62.9 %  I-STAT CG4 LACTIC ACID, ED     Status: None  Collection Time    01/27/14  9:19 AM      Result Value Ref Range   Lactic Acid, Venous 1.09  0.5 - 2.2 mmol/L  URINALYSIS, ROUTINE W REFLEX MICROSCOPIC     Status: Abnormal   Collection Time    01/27/14 10:25 AM      Result Value Ref Range   Color, Urine YELLOW  YELLOW   APPearance CLOUDY (*) CLEAR   Specific Gravity, Urine 1.016  1.005 - 1.030   pH 7.5  5.0 - 8.0   Glucose, UA NEGATIVE  NEGATIVE mg/dL   Hgb urine dipstick NEGATIVE  NEGATIVE   Bilirubin Urine NEGATIVE  NEGATIVE   Ketones, ur NEGATIVE  NEGATIVE mg/dL   Protein, ur NEGATIVE  NEGATIVE mg/dL   Urobilinogen, UA 0.2  0.0 - 1.0 mg/dL   Nitrite POSITIVE (*) NEGATIVE   Leukocytes, UA NEGATIVE  NEGATIVE  URINE MICROSCOPIC-ADD ON     Status: Abnormal   Collection Time    01/27/14 10:25 AM      Result Value Ref Range   Squamous Epithelial / LPF RARE  RARE   WBC, UA 7-10  <3 WBC/hpf   RBC / HPF 0-2  <3 RBC/hpf   Bacteria, UA MANY (*) RARE   Dg Lumbar Spine Complete  01/27/2014   CLINICAL DATA:  Fall  EXAM: LUMBAR SPINE - COMPLETE 4+ VIEW  COMPARISON:  None.  FINDINGS: Dextroscoliosis.  Mild disc degeneration at L3-4 L4-5.  Mild fracture L2 of indeterminate age. No other fracture. Negative for pars defect.  Aortoiliac atherosclerotic calcification without aneurysm  IMPRESSION: Mild compression fracture L2 of indeterminate age.   Electronically Signed   By: Marlan Palau M.D.   On: 01/27/2014 09:15   Dg Sacrum/coccyx  01/27/2014   CLINICAL DATA:  Fall  EXAM: SACRUM AND COCCYX - 2+ VIEW  COMPARISON:  None.  FINDINGS: There is no evidence of fracture or other focal bone lesions  IMPRESSION: Negative.   Electronically Signed   By: Marlan Palau M.D.   On: 01/27/2014 09:16   Ct Head Wo Contrast  01/27/2014   CLINICAL DATA:  History of Alzheimer's disease  EXAM: CT HEAD WITHOUT CONTRAST  CT CERVICAL SPINE WITHOUT CONTRAST  TECHNIQUE: Multidetector CT imaging of the head and cervical spine was performed following the standard protocol  without intravenous contrast. Multiplanar CT image reconstructions of the cervical spine were also generated.  COMPARISON:  CT HEAD W/O CM dated 10/01/2013  FINDINGS: CT HEAD FINDINGS  There is mild diffuse cerebral and cerebellar atrophy with compensatory ventriculomegaly. There is no shift of the midline. There is decreased density in the deep white matter of both cerebral hemispheres consistent with chronic small vessel ischemic type change. An old lacunar infarction in the right frontal deep white matter is present. The cerebellum and brainstem exhibit no acute abnormalities. There is no evidence of an acute intracranial hemorrhage nor of an evolving ischemic infarction.  At bone window settings the observed portions of the paranasal sinuses are clear. The mastoid air cells are hypoplastic. There is no evidence of an acute skull fracture. There is no cephalohematoma.  CT CERVICAL SPINE FINDINGS  The cervical vertebral bodies are preserved in height. There are degenerative disc changes at C5-6 and at C6-7 and C7-T1. There is no evidence of an acute fracture nor dislocation. The prevertebral soft tissue spaces appear normal. There is no evidence of a perched facet nor spinous process fracture. The odontoid is intact and the lateral masses of  C1 align normally with those of C2. The bony ring at each cervical level is intact. The observed portions of the first and second ribs appear normal. The pulmonary apices exhibit no acute abnormalities where visualized.  There is atherosclerotic calcification within the carotid systems bilaterally. There is enlargement of the right thyroid lobe and isthmus consistent with a goiter.  IMPRESSION: 1. There are chronic age related changes which appear stable. An old lacunar infarction in the right frontal lobe is present, and there is significant white matter hypodensity elsewhere consistent with chronic small vessel ischemia. There is no acute intracranial hemorrhage. 2. There is  no acute skull fracture. 3. There is no acute cervical spine fracture nor dislocation. There are mild degenerative disc and facet joint changes at multiple levels. 4. There is atherosclerotic calcification within the carotid bulbs bilaterally. 5. Again demonstrated is enlargement of the thyroid gland predominantly on the right and in the isthmus. Discrete nodules are not demonstrated today.   Electronically Signed   By: David  [place]   On: 01/27/2014 09:08   Ct Cervical Spine Wo Contrast  01/27/2014   CLINICAL DATA:  History of Alzheimer's disease  EXAM: CT HEAD WITHOUT CONTRAST  CT CERVICAL SPINE WITHOUT CONTRAST  TECHNIQUE: Multidetector CT imaging of the head and cervical spine was performed following the standard protocol without intravenous contrast. Multiplanar CT image reconstructions of the cervical spine were also generated.  COMPARISON:  CT HEAD W/O CM dated 10/01/2013  FINDINGS: CT HEAD FINDINGS  There is mild diffuse cerebral and cerebellar atrophy with compensatory ventriculomegaly. There is no shift of the midline. There is decreased density in the deep white matter of both cerebral hemispheres consistent with chronic small vessel ischemic type change. An old lacunar infarction in the right frontal deep white matter is present. The cerebellum and brainstem exhibit no acute abnormalities. There is no evidence of an acute intracranial hemorrhage nor of an evolving ischemic infarction.  At bone window settings the observed portions of the paranasal sinuses are clear. The mastoid air cells are hypoplastic. There is no evidence of an acute skull fracture. There is no cephalohematoma.  CT CERVICAL SPINE FINDINGS  The cervical vertebral bodies are preserved in height. There are degenerative disc changes at C5-6 and at C6-7 and C7-T1. There is no evidence of an acute fracture nor dislocation. The prevertebral soft tissue spaces appear normal. There is no evidence of a perched facet nor spinous process  fracture. The odontoid is intact and the lateral masses of C1 align normally with those of C2. The bony ring at each cervical level is intact. The observed portions of the first and second ribs appear normal. The pulmonary apices exhibit no acute abnormalities where visualized.  There is atherosclerotic calcification within the carotid systems bilaterally. There is enlargement of the right thyroid lobe and isthmus consistent with a goiter.  IMPRESSION: 1. There are chronic age related changes which appear stable. An old lacunar infarction in the right frontal lobe is present, and there is significant white matter hypodensity elsewhere consistent with chronic small vessel ischemia. There is no acute intracranial hemorrhage. 2. There is no acute skull fracture. 3. There is no acute cervical spine fracture nor dislocation. There are mild degenerative disc and facet joint changes at multiple levels. 4. There is atherosclerotic calcification within the carotid bulbs bilaterally. 5. Again demonstrated is enlargement of the thyroid gland predominantly on the right and in the isthmus. Discrete nodules are not demonstrated today.   Electronically Signed  By: David  [place]   On: 01/27/2014 09:08   Ct Lumbar Spine Wo Contrast  01/27/2014   CLINICAL DATA:  Fall.  Rule out fracture  EXAM: CT LUMBAR SPINE WITHOUT CONTRAST  TECHNIQUE: Multidetector CT imaging of the lumbar spine was performed without intravenous contrast administration. Multiplanar CT image reconstructions were also generated.  COMPARISON:  Lumbar radiographs today  FINDINGS: Mild compression fracture of L2 involving the superior endplate. Small cortical breaks in the superior endplate indicative of acute fracture. Minimal retropulsion of bone into the canal without significant spinal stenosis. No other fracture or mass lesion.  Lumbar levoscoliosis.  Atherosclerotic aorta without aneurysm.  L1-2: Disc degeneration with disc bulging and spondylosis. Mild spinal  stenosis  L2-3: Disc degeneration and spondylosis with mild spinal stenosis  L3-4:  Disc and facet degeneration with mild spinal stenosis  L4-5: Moderate disc bulging and spondylosis. Moderate facet hypertrophy with mild to moderate spinal stenosis.  L5-S1:  Disc and facet degeneration without stenosis  IMPRESSION: Mild compression fracture superior endplate of L2 without compromise of the canal. No other fracture or mass lesion  Scoliosis and multilevel degenerative change throughout the lumbar spine.   Electronically Signed   By: Marlan Palau M.D.   On: 01/27/2014 13:43   Dg Shoulder Left  01/27/2014   CLINICAL DATA:  Fall  EXAM: LEFT SHOULDER - 2+ VIEW  COMPARISON:  None.  FINDINGS: Negative for fracture. Chronic rotator cuff tear. Degenerative change in the shoulder joint and AC joint.  IMPRESSION: Negative for fracture.   Electronically Signed   By: Marlan Palau M.D.   On: 01/27/2014 09:14     Assessment/Plan #1 Gait Instability And Acute L2 Compression Fracture : She needs stabilization of her fracture and improved pain control. We will request a evaluation by interventional radiologist regarding kyphoplasty versus vertebroplasty to stabilize her fracture. In addition, pain medications will be started for her and will have a physical therapist evaluation of her gait. She will likely need subsequent gait rehabilitation at a skilled nursing facility. #2 Atrial Fibrillation: Stable with a controlled ventricular rate and stroke protection with pradaxa. If the radiologist feels that vertebroplasty would be useful than we will hold Pradaxa over the weekend and use heparin for stroke prophylaxis. #3 Hyperglycemia: We'll check a hemoglobin A1c level to get a better idea what her average blood glucose levels have been running. For now we will check a.m. blood glucose levels and consider sliding scale insulin. #4 Alzheimer's disease: Moderately severe we will recheck a full Folstein Mini-Mental Status  exam.  Kitt Ledet G 01/27/2014, 6:14 PM

## 2014-01-27 NOTE — Progress Notes (Signed)
Clinical Social Work Department BRIEF PSYCHOSOCIAL ASSESSMENT 01/27/2014  Patient:  Dawn Bradshaw, Dawn Bradshaw     Account Number:  [id number]     [place] date:  01/27/2014  Clinical Social Worker:  Earlie Server  Date/Time:  01/27/2014 01:00 PM  Referred by:  RN  Date Referred:  01/27/2014 Referred for  SNF Placement   Other Referral:   Interview type:  Patient Other interview type:    PSYCHOSOCIAL DATA Living Status:  FAMILY Admitted from facility:   Level of care:   Primary support name:  Dawn Bradshaw Primary support relationship to patient:  CHILD, ADULT Degree of support available:   Strong    CURRENT CONCERNS Current Concerns  Post-Acute Placement   Other Concerns:    SOCIAL WORK ASSESSMENT / PLAN CSW received referral from RN stating that patient and family were interested in placement. CSW discussed case with CM and met with patient and family at bedside. CSW introduced myself and explained role.    Patient lives at home with sons and is confused about why she is at the hospital. Patient's son and niece are in the room and report that patient fell and broke her leg last year and was at [facility] for about 3 months and returned home about 1 month ago. Son reports that patient has dementia but usually does well at home with his assistance. Son reports he is interested in placement again at [facility] or [facility].    CSW explained the need for insurance authorization prior to SNF placement. CSW awaits to see if patient will be admitted to assist with placement and if patient is not admitted CM is aware that patient will need assistance through [facility] for placement. Family is aware of plans and agreeable.    CSW completed FL2 and patient has existing pasarr. CSW spoke Katrina at [facility] who is agreeable to review information if patient is admitted. CSW will continue to follow in order to determine if patient is to be admitted.   Assessment/plan status:  Psychosocial  Support/Ongoing Assessment of Needs Other assessment/ plan:   Information/referral to community resources:   SNF information    PATIENT'S/FAMILY'S RESPONSE TO PLAN OF CARE: Patient alert but disoriented. Family present and reports that patient did well at SNF before and are worried about her returning home due to back pain. Son reports he does not want LT placement and feels if patient can complete rehab then she can return home with his assistance. Son is aware of Medicaid benefits for LT placement and reports he already has application at home. Son reports that patient is "the sweetest person I know" and that he wants her to receive rehab so she can get back home. Son thanked CSW for time and aware that patient cannot be placed from the ED.     Sindy Messing, LCSW (Coverage for [employer])

## 2014-01-27 NOTE — ED Notes (Signed)
Pt in xray

## 2014-01-27 NOTE — ED Provider Notes (Signed)
CSN: [id number]     Arrival date & time 01/27/14  4098 History   First MD Initiated Contact with Patient 01/27/14 0740     Chief Complaint  Patient presents with  . Fall     (Consider location/radiation/quality/duration/timing/severity/associated sxs/prior Treatment) HPI  80 year old female with history of osteopenia, history of non-insulin-dependent diabetes, history of atrial flutter ablation currently on Pradaxa, prior history of stroke and hypertension who was brought here via EMS for evaluation of recent fall. History limited as patient has history of dementia, level V caveat applies. Most of the history obtained through sons who is at bedside. Patient is brought in with cervical collar and spineboard. Per son, patient lives at home with him, he heard patient screen early this morning and found patient laying on the ground in her bedroom near her walker. She was responsive, complaining of "pain all over". The fall was unwitnessed. Symptoms also report patient recently had a right femoral fracture and was staying in a rehabilitation facility for 2 months, she has been home for about a month. She ambulates with a walker.  When her son tries to change her pants as she appears to have urinated on herself, patient declined help stating she has too much pain to her low back.  Past Medical History  Diagnosis Date  . Hyperlipidemia   . Thyroid disease     hypothyroidism  . Psoriasis   . Osteopenia   . LBP (low back pain)     mild  . Palpitations     H/O  . Diabetes mellitus     Type II  . Alzheimer's disease     mild  . Arrhythmia 4/12    A-Fib @ [facility]  . Atrial fibrillation   . Stroke   . Hypertension    Past Surgical History  Procedure Laterality Date  . Cleft palate repair    . Myomectomy  1980  . Colonoscopy  1999  . Orif hip fracture  08/2002  . Cataract extraction  08/2008  . Femur im nail Right 10/01/2013    Procedure: INTRAMEDULLARY (IM) NAIL FEMORAL;  Surgeon: Nestor Lewandowsky, MD;  Location: [facility];  Service: Orthopedics;  Laterality: Right;   Family History  Problem Relation Age of Onset  . Heart disease Brother    History  Substance Use Topics  . Smoking status: Former Smoker    Types: Cigarettes    Quit date: 11/25/1948  . Smokeless tobacco: Never Used  . Alcohol Use: No   OB History   Grav Para Term Preterm Abortions TAB SAB Ect Mult Living                 Review of Systems  Unable to perform ROS: Dementia      Allergies  Review of patient's allergies indicates no known allergies.  Home Medications   Current Outpatient Rx  Name  Route  Sig  Dispense  Refill  . Calcium-Vitamin D (CALTRATE 600 PLUS-VIT D PO)   Oral   Take 600 mg by mouth daily.          . citalopram (CELEXA) 10 MG tablet   Oral   Take 10 mg by mouth at bedtime.         . dabigatran (PRADAXA) 150 MG CAPS   Oral   Take 150 mg by mouth daily.          Marland Kitchen donepezil (ARICEPT) 10 MG tablet   Oral   Take 10 mg by mouth daily.          Marland Kitchen  HYDROcodone-acetaminophen (NORCO/VICODIN) 5-325 MG per tablet   Oral   Take 1-2 tablets by mouth every 6 (six) hours as needed for moderate pain.   30 tablet   0   . levothyroxine (SYNTHROID) 25 MCG tablet   Oral   Take 12.5 mcg by mouth daily.          Marland Kitchen losartan-hydrochlorothiazide (HYZAAR) 100-12.5 MG per tablet   Oral   Take 0.5 tablets by mouth daily.         Marland Kitchen oxyCODONE-acetaminophen (ROXICET) 5-325 MG per tablet   Oral   Take 1 tablet by mouth every 4 (four) hours as needed for severe pain.   60 tablet   0   . Trospium Chloride (SANCTURA XR) 60 MG CP24   Oral   Take 60 mg by mouth daily.           BP 150/89  Pulse 84  Temp(Src) 98.3 F (36.8 C) (Oral)  Resp 18  SpO2 93% Physical Exam  Nursing note and vitals reviewed. Constitutional: She appears well-developed and well-nourished. No distress.  Patient requests for c-collar to be removed due to discomfort, however in no acute distress.   HENT:  Head: Normocephalic and atraumatic.  No hemotympanum, no septal hematoma, no malocclusion, no significant midface tenderness  Eyes: Conjunctivae are normal.  Neck: Neck supple.  C-collar in place, no significant midline spine tenderness, crepitus, or step-off  Cardiovascular: Exam reveals no gallop and no friction rub.   No murmur heard. Irregularly irregular rhythm  Pulmonary/Chest: Effort normal and breath sounds normal.  Poor effort, but no obvious wheezes, rales, rhonchi   Abdominal: Soft. There is no tenderness.  Musculoskeletal: She exhibits tenderness (Lumbar spine, and sacral tenderness on palpation without crepitus step-off. Tenderness to left shoulder on palpation with limited range of motion without any obvious deformity. No other focal point tenderness or deformity.).  Neurological: She is alert.  Able to answer all basic questions  Skin: No rash noted.  Psychiatric: She has a normal mood and affect.    ED Course  Procedures (including critical care time)  8:03 AM Patient here for an unwitnessed fall. She has history of dementia therefore the history was limited. She is currently on Pradaxa; will obtain head and neck CT scan. She does have focal tenderness to her left shoulder, low back and sacral region which I will also x-ray. Workup initiated.  9:42 AM Head and cervical CT scan without evidence of acute fractures or dislocation. L. spine demonstrate a mild compression fracture at L2 of indeterminate age. Left shoulder x-ray is negative for fracture. X-ray of sacrum and coccyx is negative for fracture. Patient felt much better after c-collar was removed. She is neurovascularly intact and able to have full range of motion throughout her neck. Her labs are reassuring.  10:58 AM Pt has a difficult time walking 2/2 pain even with assist, however able to flex and extend both hips without significant discomfort.  Has prior R femoral fx and L hip replacement.    11:31  AM Care discussed with case management, who will consult pt and also to provide outpt resources.  Family member felt they are unable to care for her at home and felt she will eventually need to be placed in a care facility.    12:42 PM UA with evidence of UTI.  Family members report noticing strong urine odor for the past few days.  Also pt continue to endorse moderate low back pain with movement.  Xray of  Lspine and Sacral/coccyx region neg for acute fx.  Will obtain lspine CT to r/o occult fx. Will consult [facility] for admission.    1:27 PM I have consulted [facility], Dr. Eloise Harman, who agrees to admit pt for further care. He will place admitting orders and bed request. Rocephin given for UTI.    Labs Review Labs Reviewed  CBC WITH DIFFERENTIAL - Abnormal; Notable for the following:    Neutrophils Relative % 79 (*)    All other components within normal limits  URINALYSIS, ROUTINE W REFLEX MICROSCOPIC - Abnormal; Notable for the following:    APPearance CLOUDY (*)    Nitrite POSITIVE (*)    All other components within normal limits  URINE MICROSCOPIC-ADD ON - Abnormal; Notable for the following:    Bacteria, UA MANY (*)    All other components within normal limits  I-STAT CHEM 8, ED - Abnormal; Notable for the following:    Potassium 3.5 (*)    Glucose, Bld 139 (*)    All other components within normal limits  CBG MONITORING, ED - Abnormal; Notable for the following:    Glucose-Capillary 131 (*)    All other components within normal limits  I-STAT TROPOININ, ED  I-STAT CG4 LACTIC ACID, ED   Imaging Review Dg Lumbar Spine Complete  01/27/2014   CLINICAL DATA:  Fall  EXAM: LUMBAR SPINE - COMPLETE 4+ VIEW  COMPARISON:  None.  FINDINGS: Dextroscoliosis.  Mild disc degeneration at L3-4 L4-5.  Mild fracture L2 of indeterminate age. No other fracture. Negative for pars defect.  Aortoiliac atherosclerotic calcification without aneurysm  IMPRESSION: Mild compression fracture L2 of  indeterminate age.   Electronically Signed   By: Marlan Palau M.D.   On: 01/27/2014 09:15   Dg Sacrum/coccyx  01/27/2014   CLINICAL DATA:  Fall  EXAM: SACRUM AND COCCYX - 2+ VIEW  COMPARISON:  None.  FINDINGS: There is no evidence of fracture or other focal bone lesions  IMPRESSION: Negative.   Electronically Signed   By: Marlan Palau M.D.   On: 01/27/2014 09:16   Ct Head Wo Contrast  01/27/2014   CLINICAL DATA:  History of Alzheimer's disease  EXAM: CT HEAD WITHOUT CONTRAST  CT CERVICAL SPINE WITHOUT CONTRAST  TECHNIQUE: Multidetector CT imaging of the head and cervical spine was performed following the standard protocol without intravenous contrast. Multiplanar CT image reconstructions of the cervical spine were also generated.  COMPARISON:  CT HEAD W/O CM dated 10/01/2013  FINDINGS: CT HEAD FINDINGS  There is mild diffuse cerebral and cerebellar atrophy with compensatory ventriculomegaly. There is no shift of the midline. There is decreased density in the deep white matter of both cerebral hemispheres consistent with chronic small vessel ischemic type change. An old lacunar infarction in the right frontal deep white matter is present. The cerebellum and brainstem exhibit no acute abnormalities. There is no evidence of an acute intracranial hemorrhage nor of an evolving ischemic infarction.  At bone window settings the observed portions of the paranasal sinuses are clear. The mastoid air cells are hypoplastic. There is no evidence of an acute skull fracture. There is no cephalohematoma.  CT CERVICAL SPINE FINDINGS  The cervical vertebral bodies are preserved in height. There are degenerative disc changes at C5-6 and at C6-7 and C7-T1. There is no evidence of an acute fracture nor dislocation. The prevertebral soft tissue spaces appear normal. There is no evidence of a perched facet nor spinous process fracture. The odontoid is intact and the  lateral masses of C1 align normally with those of C2. The bony  ring at each cervical level is intact. The observed portions of the first and second ribs appear normal. The pulmonary apices exhibit no acute abnormalities where visualized.  There is atherosclerotic calcification within the carotid systems bilaterally. There is enlargement of the right thyroid lobe and isthmus consistent with a goiter.  IMPRESSION: 1. There are chronic age related changes which appear stable. An old lacunar infarction in the right frontal lobe is present, and there is significant white matter hypodensity elsewhere consistent with chronic small vessel ischemia. There is no acute intracranial hemorrhage. 2. There is no acute skull fracture. 3. There is no acute cervical spine fracture nor dislocation. There are mild degenerative disc and facet joint changes at multiple levels. 4. There is atherosclerotic calcification within the carotid bulbs bilaterally. 5. Again demonstrated is enlargement of the thyroid gland predominantly on the right and in the isthmus. Discrete nodules are not demonstrated today.   Electronically Signed   By: David  [place]   On: 01/27/2014 09:08   Ct Cervical Spine Wo Contrast  01/27/2014   CLINICAL DATA:  History of Alzheimer's disease  EXAM: CT HEAD WITHOUT CONTRAST  CT CERVICAL SPINE WITHOUT CONTRAST  TECHNIQUE: Multidetector CT imaging of the head and cervical spine was performed following the standard protocol without intravenous contrast. Multiplanar CT image reconstructions of the cervical spine were also generated.  COMPARISON:  CT HEAD W/O CM dated 10/01/2013  FINDINGS: CT HEAD FINDINGS  There is mild diffuse cerebral and cerebellar atrophy with compensatory ventriculomegaly. There is no shift of the midline. There is decreased density in the deep white matter of both cerebral hemispheres consistent with chronic small vessel ischemic type change. An old lacunar infarction in the right frontal deep white matter is present. The cerebellum and brainstem exhibit no acute  abnormalities. There is no evidence of an acute intracranial hemorrhage nor of an evolving ischemic infarction.  At bone window settings the observed portions of the paranasal sinuses are clear. The mastoid air cells are hypoplastic. There is no evidence of an acute skull fracture. There is no cephalohematoma.  CT CERVICAL SPINE FINDINGS  The cervical vertebral bodies are preserved in height. There are degenerative disc changes at C5-6 and at C6-7 and C7-T1. There is no evidence of an acute fracture nor dislocation. The prevertebral soft tissue spaces appear normal. There is no evidence of a perched facet nor spinous process fracture. The odontoid is intact and the lateral masses of C1 align normally with those of C2. The bony ring at each cervical level is intact. The observed portions of the first and second ribs appear normal. The pulmonary apices exhibit no acute abnormalities where visualized.  There is atherosclerotic calcification within the carotid systems bilaterally. There is enlargement of the right thyroid lobe and isthmus consistent with a goiter.  IMPRESSION: 1. There are chronic age related changes which appear stable. An old lacunar infarction in the right frontal lobe is present, and there is significant white matter hypodensity elsewhere consistent with chronic small vessel ischemia. There is no acute intracranial hemorrhage. 2. There is no acute skull fracture. 3. There is no acute cervical spine fracture nor dislocation. There are mild degenerative disc and facet joint changes at multiple levels. 4. There is atherosclerotic calcification within the carotid bulbs bilaterally. 5. Again demonstrated is enlargement of the thyroid gland predominantly on the right and in the isthmus. Discrete nodules are not demonstrated today.  Electronically Signed   By: David  [place]   On: 01/27/2014 09:08   Dg Shoulder Left  01/27/2014   CLINICAL DATA:  Fall  EXAM: LEFT SHOULDER - 2+ VIEW  COMPARISON:  None.   FINDINGS: Negative for fracture. Chronic rotator cuff tear. Degenerative change in the shoulder joint and AC joint.  IMPRESSION: Negative for fracture.   Electronically Signed   By: Marlan Palauharles  Clark M.D.   On: 01/27/2014 09:14     EKG Interpretation None       Date: 01/27/2014  Rate: 81  Rhythm: atrial fibrillation  QRS Axis: normal  Intervals: normal  ST/T Wave abnormalities: nonspecific ST/T changes  Conduction Disutrbances:none  Narrative Interpretation:   Old EKG Reviewed: unchanged    MDM   Final diagnoses:  Fall at home  UTI (lower urinary tract infection)   BP 150/89  Pulse 84  Temp(Src) 98.3 F (36.8 C) (Oral)  Resp 18  SpO2 93%  I have reviewed nursing notes and vital signs. I personally reviewed the imaging tests through PACS system  I reviewed available ER/hospitalization records thought the EMR     Fayrene HelperBowie Kathyjo Briere, PA-C 01/27/14 1552  Fayrene HelperBowie Gibson Lad, PA-C 01/27/14 1553

## 2014-01-27 NOTE — ED Notes (Signed)
Per EMS, pt from home.  Pt coming from restroom this am with walker.  Pt stumbled and fell to ground on left side.  No LOC.  Not witnessed but family in home.  No deformity noted.  Pt c/o left shoulder and lower back pain.  Pt placed on LSB with cervical collar.  Vitals:  160/80, hr 82, resp 18,

## 2014-01-27 NOTE — Progress Notes (Signed)
Per chart review patient to be admitted. Unit CSW to continue to follow up with placement.   Byrd HesselbachKristen Zhoe Catania, LCSW [phone]  ED CSW 01/27/2014 1521pm

## 2014-01-27 NOTE — ED Notes (Signed)
Dawn Bradshaw (son and POA) [phone]

## 2014-01-28 ENCOUNTER — Inpatient Hospital Stay (HOSPITAL_COMMUNITY): Payer: Medicare PPO

## 2014-01-28 ENCOUNTER — Encounter (HOSPITAL_COMMUNITY): Payer: Self-pay | Admitting: Radiology

## 2014-01-28 LAB — COMPREHENSIVE METABOLIC PANEL
ALT: 10 U/L (ref 0–35)
AST: 13 U/L (ref 0–37)
Albumin: 2.7 g/dL — ABNORMAL LOW (ref 3.5–5.2)
Alkaline Phosphatase: 76 U/L (ref 39–117)
BUN: 18 mg/dL (ref 6–23)
CALCIUM: 9.1 mg/dL (ref 8.4–10.5)
CO2: 25 meq/L (ref 19–32)
CREATININE: 0.92 mg/dL (ref 0.50–1.10)
Chloride: 101 mEq/L (ref 96–112)
GFR, EST AFRICAN AMERICAN: 64 mL/min — AB (ref >90)
GFR, EST NON AFRICAN AMERICAN: 56 mL/min — AB (ref >90)
GLUCOSE: 152 mg/dL — AB (ref 70–99)
Potassium: 3.7 mEq/L (ref 3.7–5.3)
Sodium: 139 mEq/L (ref 137–147)
Total Bilirubin: 0.6 mg/dL (ref 0.3–1.2)
Total Protein: 5.6 g/dL — ABNORMAL LOW (ref 6.0–8.3)

## 2014-01-28 LAB — CBC
HCT: 34.7 % — ABNORMAL LOW (ref 36.0–46.0)
HEMOGLOBIN: 11.6 g/dL — AB (ref 12.0–15.0)
MCH: 28.1 pg (ref 26.0–34.0)
MCHC: 33.4 g/dL (ref 30.0–36.0)
MCV: 84 fL (ref 78.0–100.0)
Platelets: 204 10*3/uL (ref 150–400)
RBC: 4.13 MIL/uL (ref 3.87–5.11)
RDW: 14.6 % (ref 11.5–15.5)
WBC: 6.3 10*3/uL (ref 4.0–10.5)

## 2014-01-28 LAB — HEMOGLOBIN A1C
Hgb A1c MFr Bld: 6 % — ABNORMAL HIGH (ref <5.7)
Mean Plasma Glucose: 126 mg/dL — ABNORMAL HIGH (ref <117)

## 2014-01-28 MED ORDER — POLYETHYLENE GLYCOL 3350 17 G PO PACK
17.0000 g | PACK | Freq: Every day | ORAL | Status: DC
  Administered 2014-01-29 – 2014-02-01 (×3): 17 g via ORAL

## 2014-01-28 MED ORDER — CALCIUM CARBONATE 1250 (500 CA) MG PO TABS
1250.0000 mg | ORAL_TABLET | Freq: Every day | ORAL | Status: DC
  Administered 2014-01-28 – 2014-02-01 (×4): 1250 mg via ORAL
  Filled 2014-01-28 (×6): qty 1

## 2014-01-28 MED ORDER — HEPARIN (PORCINE) IN NACL 100-0.45 UNIT/ML-% IJ SOLN
1250.0000 [IU]/h | INTRAMUSCULAR | Status: DC
  Administered 2014-01-28: 1000 [IU]/h via INTRAVENOUS
  Filled 2014-01-28 (×2): qty 250

## 2014-01-28 MED ORDER — DEXTROSE 5 % IV SOLN
1.0000 g | INTRAVENOUS | Status: DC
  Administered 2014-01-28 – 2014-01-30 (×3): 1 g via INTRAVENOUS
  Filled 2014-01-28 (×5): qty 10

## 2014-01-28 MED ORDER — MAGNESIUM HYDROXIDE 400 MG/5ML PO SUSP
15.0000 mL | Freq: Every day | ORAL | Status: DC | PRN
  Administered 2014-01-28: 15 mL via ORAL
  Filled 2014-01-28: qty 30

## 2014-01-28 NOTE — Progress Notes (Signed)
Subjective: She slept well and did require some dilaudid for back pain, has constipation  Objective: Vital signs in last 24 hours: Temp:  [97.9 F (36.6 C)-98.7 F (37.1 C)] 98.7 F (37.1 C) (03/06 0634) Pulse Rate:  [84-123] 123 (03/06 0634) Resp:  [18-20] 18 (03/06 0634) BP: (131-167)/(74-92) 167/84 mmHg (03/06 0634) SpO2:  [91 %-98 %] 91 % (03/06 0634) Weight change:    Intake/Output from previous day: 03/05 0701 - 03/06 0700 In: 120 [P.O.:120] Out: -    General appearance: alert, cooperative and no distress Resp: clear to auscultation bilaterally Cardio: regular rate and rhythm and irregularly irregular rhythm GI: soft, non-tender; bowel sounds normal; no masses,  no organomegaly and with some retained stool  Lab Results:  Recent Labs  01/27/14 2002 01/28/14 0405  WBC 7.2 6.3  HGB 13.1 11.6*  HCT 38.6 34.7*  PLT 213 204   BMET  Recent Labs  01/27/14 0918 01/27/14 2002 01/28/14 0405  NA 139  --  139  K 3.5*  --  3.7  CL 101  --  101  CO2  --   --  25  GLUCOSE 139*  --  152*  BUN 18  --  18  CREATININE 0.90 0.79 0.92  CALCIUM  --   --  9.1   CMET CMP     Component Value Date/Time   NA 139 01/28/2014 0405   K 3.7 01/28/2014 0405   CL 101 01/28/2014 0405   CO2 25 01/28/2014 0405   GLUCOSE 152* 01/28/2014 0405   BUN 18 01/28/2014 0405   CREATININE 0.92 01/28/2014 0405   CALCIUM 9.1 01/28/2014 0405   PROT 5.6* 01/28/2014 0405   ALBUMIN 2.7* 01/28/2014 0405   AST 13 01/28/2014 0405   ALT 10 01/28/2014 0405   ALKPHOS 76 01/28/2014 0405   BILITOT 0.6 01/28/2014 0405   GFRNONAA 56* 01/28/2014 0405   GFRAA 64* 01/28/2014 0405    CBG (last 3)   Recent Labs  01/27/14 0804  GLUCAP 131*    INR RESULTS:   Lab Results  Component Value Date   INR 1.12 10/01/2013     Studies/Results: Dg Lumbar Spine Complete  01/27/2014   CLINICAL DATA:  Fall  EXAM: LUMBAR SPINE - COMPLETE 4+ VIEW  COMPARISON:  None.  FINDINGS: Dextroscoliosis.  Mild disc degeneration at L3-4 L4-5.  Mild  fracture L2 of indeterminate age. No other fracture. Negative for pars defect.  Aortoiliac atherosclerotic calcification without aneurysm  IMPRESSION: Mild compression fracture L2 of indeterminate age.   Electronically Signed   By: Marlan Palau M.D.   On: 01/27/2014 09:15   Dg Sacrum/coccyx  01/27/2014   CLINICAL DATA:  Fall  EXAM: SACRUM AND COCCYX - 2+ VIEW  COMPARISON:  None.  FINDINGS: There is no evidence of fracture or other focal bone lesions  IMPRESSION: Negative.   Electronically Signed   By: Marlan Palau M.D.   On: 01/27/2014 09:16   Ct Head Wo Contrast  01/27/2014   CLINICAL DATA:  History of Alzheimer's disease  EXAM: CT HEAD WITHOUT CONTRAST  CT CERVICAL SPINE WITHOUT CONTRAST  TECHNIQUE: Multidetector CT imaging of the head and cervical spine was performed following the standard protocol without intravenous contrast. Multiplanar CT image reconstructions of the cervical spine were also generated.  COMPARISON:  CT HEAD W/O CM dated 10/01/2013  FINDINGS: CT HEAD FINDINGS  There is mild diffuse cerebral and cerebellar atrophy with compensatory ventriculomegaly. There is no shift of the midline. There is decreased  density in the deep white matter of both cerebral hemispheres consistent with chronic small vessel ischemic type change. An old lacunar infarction in the right frontal deep white matter is present. The cerebellum and brainstem exhibit no acute abnormalities. There is no evidence of an acute intracranial hemorrhage nor of an evolving ischemic infarction.  At bone window settings the observed portions of the paranasal sinuses are clear. The mastoid air cells are hypoplastic. There is no evidence of an acute skull fracture. There is no cephalohematoma.  CT CERVICAL SPINE FINDINGS  The cervical vertebral bodies are preserved in height. There are degenerative disc changes at C5-6 and at C6-7 and C7-T1. There is no evidence of an acute fracture nor dislocation. The prevertebral soft tissue spaces  appear normal. There is no evidence of a perched facet nor spinous process fracture. The odontoid is intact and the lateral masses of C1 align normally with those of C2. The bony ring at each cervical level is intact. The observed portions of the first and second ribs appear normal. The pulmonary apices exhibit no acute abnormalities where visualized.  There is atherosclerotic calcification within the carotid systems bilaterally. There is enlargement of the right thyroid lobe and isthmus consistent with a goiter.  IMPRESSION: 1. There are chronic age related changes which appear stable. An old lacunar infarction in the right frontal lobe is present, and there is significant white matter hypodensity elsewhere consistent with chronic small vessel ischemia. There is no acute intracranial hemorrhage. 2. There is no acute skull fracture. 3. There is no acute cervical spine fracture nor dislocation. There are mild degenerative disc and facet joint changes at multiple levels. 4. There is atherosclerotic calcification within the carotid bulbs bilaterally. 5. Again demonstrated is enlargement of the thyroid gland predominantly on the right and in the isthmus. Discrete nodules are not demonstrated today.   Electronically Signed   By: David  Swazil[place]   On: 01/27/2014 09:08   Ct Cervical Spine Wo Contrast  01/27/2014   CLINICAL DATA:  History of Alzheimer's disease  EXAM: CT HEAD WITHOUT CONTRAST  CT CERVICAL SPINE WITHOUT CONTRAST  TECHNIQUE: Multidetector CT imaging of the head and cervical spine was performed following the standard protocol without intravenous contrast. Multiplanar CT image reconstructions of the cervical spine were also generated.  COMPARISON:  CT HEAD W/O CM dated 10/01/2013  FINDINGS: CT HEAD FINDINGS  There is mild diffuse cerebral and cerebellar atrophy with compensatory ventriculomegaly. There is no shift of the midline. There is decreased density in the deep white matter of both cerebral hemispheres  consistent with chronic small vessel ischemic type change. An old lacunar infarction in the right frontal deep white matter is present. The cerebellum and brainstem exhibit no acute abnormalities. There is no evidence of an acute intracranial hemorrhage nor of an evolving ischemic infarction.  At bone window settings the observed portions of the paranasal sinuses are clear. The mastoid air cells are hypoplastic. There is no evidence of an acute skull fracture. There is no cephalohematoma.  CT CERVICAL SPINE FINDINGS  The cervical vertebral bodies are preserved in height. There are degenerative disc changes at C5-6 and at C6-7 and C7-T1. There is no evidence of an acute fracture nor dislocation. The prevertebral soft tissue spaces appear normal. There is no evidence of a perched facet nor spinous process fracture. The odontoid is intact and the lateral masses of C1 align normally with those of C2. The bony ring at each cervical level is intact. The observed portions  of the first and second ribs appear normal. The pulmonary apices exhibit no acute abnormalities where visualized.  There is atherosclerotic calcification within the carotid systems bilaterally. There is enlargement of the right thyroid lobe and isthmus consistent with a goiter.  IMPRESSION: 1. There are chronic age related changes which appear stable. An old lacunar infarction in the right frontal lobe is present, and there is significant white matter hypodensity elsewhere consistent with chronic small vessel ischemia. There is no acute intracranial hemorrhage. 2. There is no acute skull fracture. 3. There is no acute cervical spine fracture nor dislocation. There are mild degenerative disc and facet joint changes at multiple levels. 4. There is atherosclerotic calcification within the carotid bulbs bilaterally. 5. Again demonstrated is enlargement of the thyroid gland predominantly on the right and in the isthmus. Discrete nodules are not demonstrated  today.   Electronically Signed   By: David  [place]   On: 01/27/2014 09:08   Ct Lumbar Spine Wo Contrast  01/27/2014   CLINICAL DATA:  Fall.  Rule out fracture  EXAM: CT LUMBAR SPINE WITHOUT CONTRAST  TECHNIQUE: Multidetector CT imaging of the lumbar spine was performed without intravenous contrast administration. Multiplanar CT image reconstructions were also generated.  COMPARISON:  Lumbar radiographs today  FINDINGS: Mild compression fracture of L2 involving the superior endplate. Small cortical breaks in the superior endplate indicative of acute fracture. Minimal retropulsion of bone into the canal without significant spinal stenosis. No other fracture or mass lesion.  Lumbar levoscoliosis.  Atherosclerotic aorta without aneurysm.  L1-2: Disc degeneration with disc bulging and spondylosis. Mild spinal stenosis  L2-3: Disc degeneration and spondylosis with mild spinal stenosis  L3-4:  Disc and facet degeneration with mild spinal stenosis  L4-5: Moderate disc bulging and spondylosis. Moderate facet hypertrophy with mild to moderate spinal stenosis.  L5-S1:  Disc and facet degeneration without stenosis  IMPRESSION: Mild compression fracture superior endplate of L2 without compromise of the canal. No other fracture or mass lesion  Scoliosis and multilevel degenerative change throughout the lumbar spine.   Electronically Signed   By: Marlan Palau M.D.   On: 01/27/2014 13:43   Dg Shoulder Left  01/27/2014   CLINICAL DATA:  Fall  EXAM: LEFT SHOULDER - 2+ VIEW  COMPARISON:  None.  FINDINGS: Negative for fracture. Chronic rotator cuff tear. Degenerative change in the shoulder joint and AC joint.  IMPRESSION: Negative for fracture.   Electronically Signed   By: Marlan Palau M.D.   On: 01/27/2014 09:14    Medications: I have reviewed the patient's current medications.  Assessment/Plan: #1 Acute Compression Fracture: pain is stable when supine, will have PT evaluation today and radiologist eval for possible  vertebroplasty. #2 Afib: stable and will stop pradaxa and start heparin if vertebroplasty recommended #3 DM2:  Stable on po meds   LOS: 1 day   Dyron Kawano G 01/28/2014, 7:36 AM

## 2014-01-28 NOTE — Progress Notes (Signed)
UR completed. Steve Youngberg RN CCM Case Mgmt phone [phone] 

## 2014-01-28 NOTE — Progress Notes (Signed)
CSW met with family to assist with d/c planning. ST Rehab will be needed following hospital d/c. Family would like pt to go to [facility]. Clinicals sent to SNF and a decision is pending. Pt has [facility]. CSW will request authorization for placement once [facility] provides bed offer.   Werner Lean LCSW [phone]

## 2014-01-28 NOTE — Progress Notes (Signed)
Case discussed with interventional radiology PA, and will discontinue Pradaxa, start heparin IV, obtain L-spine MRI prior to possible vertebroplasty or kyphoplasty on Monday.

## 2014-01-28 NOTE — ED Provider Notes (Signed)
Medical screening examination/treatment/procedure(s) were performed by non-physician practitioner and as supervising physician I was immediately available for consultation/collaboration.   EKG Interpretation None       Mallori Araque, MD 01/28/14 0839 

## 2014-01-28 NOTE — Progress Notes (Signed)
ANTICOAGULATION CONSULT NOTE - Initial Consult  Pharmacy Consult for IV Heparin Indication: atrial fibrillation  Allergies  Allergen Reactions  . Codeine Hives, Itching and Rash    Patient Measurements: Weight: 154 lb (69.854 kg) Heparin Dosing Weight: actual weight ~70 kg  Vital Signs: Temp: 98.4 F (36.9 C) (03/06 1353) Temp src: Axillary (03/06 1353) BP: 152/86 mmHg (03/06 1353) Pulse Rate: 100 (03/06 1353)  Labs:  Recent Labs  01/27/14 0912 01/27/14 0918 01/27/14 2002 01/28/14 0405  HGB 13.1 13.9 13.1 11.6*  HCT 39.6 41.0 38.6 34.7*  PLT 219  --  213 204  CREATININE  --  0.90 0.79 0.92    The CrCl is unknown because both a height and weight (above a minimum accepted value) are required for this calculation.   Medical History: Past Medical History  Diagnosis Date  . Hyperlipidemia   . Thyroid disease     hypothyroidism  . Psoriasis   . Osteopenia   . LBP (low back pain)     mild  . Palpitations     H/O  . Diabetes mellitus     Type II  . Alzheimer's disease     mild  . Arrhythmia 4/12    A-Fib @ [facility]  . Atrial fibrillation   . Stroke   . Hypertension     Assessment: [age] yoF with L2 compression fracture awaiting IR eval for possible vertebroplasty / kyphoplasty.  Has been on Pradaxa 150 mg daily for atrial fibrillation (daily dosing vs BID dosing due to advanced age).  Plan is to discontinue Pradaxa and start IV heparin while awaiting possible vertebroplasty or kyphoplasty on Monday.   Last dose of Pradaxa 150 mg was 3/5 at 2100.  Given 24 hour dosing interval for Pradaxa in this patient, will start IV heparin (without bolus) 24 hours following last dose.  SCr WNL, CrCl~54 ml/min  Hgb 11.6, Plts WNL  Dosing weight = actual weight of 70 kg  Goal of Therapy:  Heparin level 0.3-0.7 units/ml Monitor platelets by anticoagulation protocol: Yes   Plan:  1.  Start IV heparin infusion tonight at 2100 at rate of 1000 units/hr. 2.  Check heparin  level 8 hours following start of infusion. 3.  Daily HL and CBC while on heparin. 4.  F/u plan for procedure and plan for management of anticoagulation pre- and post-operatively.  Clance Bollunyon, Graceann Boileau 01/28/2014,7:19 PM

## 2014-01-28 NOTE — Progress Notes (Signed)
PT Cancellation Note  Patient Details Name: Lezlie Octavennie R XXXMiller MRN: [id number] DOB: 09/09/1927   Cancelled Treatment:     Pt with significant pain with all attempts at movement per RN.  RN states consult in place for possible intervention ie kyphoplasty?   PT deferred this date.  Will follow in am.   Laren Orama 01/28/2014, 12:18 PM

## 2014-01-29 LAB — HEPARIN LEVEL (UNFRACTIONATED)
Heparin Unfractionated: 0.19 IU/mL — ABNORMAL LOW (ref 0.30–0.70)
Heparin Unfractionated: 0.78 IU/mL — ABNORMAL HIGH (ref 0.30–0.70)

## 2014-01-29 LAB — CBC
HCT: 35.2 % — ABNORMAL LOW (ref 36.0–46.0)
Hemoglobin: 11.3 g/dL — ABNORMAL LOW (ref 12.0–15.0)
MCH: 27.1 pg (ref 26.0–34.0)
MCHC: 32.1 g/dL (ref 30.0–36.0)
MCV: 84.4 fL (ref 78.0–100.0)
PLATELETS: 198 10*3/uL (ref 150–400)
RBC: 4.17 MIL/uL (ref 3.87–5.11)
RDW: 14.7 % (ref 11.5–15.5)
WBC: 6.7 10*3/uL (ref 4.0–10.5)

## 2014-01-29 MED ORDER — HEPARIN (PORCINE) IN NACL 100-0.45 UNIT/ML-% IJ SOLN
1100.0000 [IU]/h | INTRAMUSCULAR | Status: DC
  Administered 2014-01-29: 1100 [IU]/h via INTRAVENOUS
  Filled 2014-01-29: qty 250

## 2014-01-29 NOTE — Progress Notes (Signed)
ANTICOAGULATION CONSULT NOTE - Follow up  Heparin level returns at 0.78 on 1250 units/hr which is > goal of 0.3-0.7  No bleeding reported per RN  Plan:  Decrease heparin to 1100 units/hr  Recheck level in 8hrs  Loralee PacasErin Kemyra August, PharmD, BCPS 01/29/2014 4:39 PM Pharmacy [phone]

## 2014-01-29 NOTE — Progress Notes (Signed)
PT Cancellation Note  Patient Details Name: Dawn Bradshaw MRN: [id number] DOB: March 10, 1927   Cancelled Treatment:     PT deferred - RN reporting ++pain with all attempts to move and possible kypho/vertebroplasty Monday.  Will follow.   [name] 01/29/2014, 12:22 PM

## 2014-01-29 NOTE — Progress Notes (Signed)
Subjective: Doing ok, recumbent, eating her breakfast.  DOes not complain of pain.  Objective: Vital signs in last 24 hours: Temp:  [98.4 F (36.9 C)-99.4 F (37.4 C)] 99.4 F (37.4 C) (03/07 0544) Pulse Rate:  [91-102] 102 (03/07 0544) Resp:  [18] 18 (03/07 0544) BP: (133-157)/(79-86) 157/84 mmHg (03/07 0544) SpO2:  [90 %-96 %] 96 % (03/07 0544) Weight:  [69.854 kg (154 lb)] 69.854 kg (154 lb) (03/06 1900) Weight change:  Last BM Date: 01/26/14  Intake/Output from previous day: 03/06 0701 - 03/07 0700 In: 488.5 [I.V.:395.3; IV Piggyback:50] Out: -  Intake/Output this shift: Total I/O In: 120 [P.O.:120] Out: -   General appearance: alert, cooperative and no distress  Resp: clear to auscultation bilaterally  Cardio: regular rate and rhythm and irregularly irregular rhythm  GI: soft, non-tender; bowel sounds normal; no masses, no organomegaly    Lab Results:  Recent Labs  01/28/14 0405 01/29/14 0410  WBC 6.3 6.7  HGB 11.6* 11.3*  HCT 34.7* 35.2*  PLT 204 198   BMET  Recent Labs  01/27/14 0918 01/27/14 2002 01/28/14 0405  NA 139  --  139  K 3.5*  --  3.7  CL 101  --  101  CO2  --   --  25  GLUCOSE 139*  --  152*  BUN 18  --  18  CREATININE 0.90 0.79 0.92  CALCIUM  --   --  9.1    Studies/Results: Dg Lumbar Spine Complete  01/27/2014   CLINICAL DATA:  Fall  EXAM: LUMBAR SPINE - COMPLETE 4+ VIEW  COMPARISON:  None.  FINDINGS: Dextroscoliosis.  Mild disc degeneration at L3-4 L4-5.  Mild fracture L2 of indeterminate age. No other fracture. Negative for pars defect.  Aortoiliac atherosclerotic calcification without aneurysm  IMPRESSION: Mild compression fracture L2 of indeterminate age.   Electronically Signed   By: Marlan Palau M.D.   On: 01/27/2014 09:15   Dg Sacrum/coccyx  01/27/2014   CLINICAL DATA:  Fall  EXAM: SACRUM AND COCCYX - 2+ VIEW  COMPARISON:  None.  FINDINGS: There is no evidence of fracture or other focal bone lesions  IMPRESSION: Negative.    Electronically Signed   By: Marlan Palau M.D.   On: 01/27/2014 09:16   Ct Lumbar Spine Wo Contrast  01/27/2014   CLINICAL DATA:  Fall.  Rule out fracture  EXAM: CT LUMBAR SPINE WITHOUT CONTRAST  TECHNIQUE: Multidetector CT imaging of the lumbar spine was performed without intravenous contrast administration. Multiplanar CT image reconstructions were also generated.  COMPARISON:  Lumbar radiographs today  FINDINGS: Mild compression fracture of L2 involving the superior endplate. Small cortical breaks in the superior endplate indicative of acute fracture. Minimal retropulsion of bone into the canal without significant spinal stenosis. No other fracture or mass lesion.  Lumbar levoscoliosis.  Atherosclerotic aorta without aneurysm.  L1-2: Disc degeneration with disc bulging and spondylosis. Mild spinal stenosis  L2-3: Disc degeneration and spondylosis with mild spinal stenosis  L3-4:  Disc and facet degeneration with mild spinal stenosis  L4-5: Moderate disc bulging and spondylosis. Moderate facet hypertrophy with mild to moderate spinal stenosis.  L5-S1:  Disc and facet degeneration without stenosis  IMPRESSION: Mild compression fracture superior endplate of L2 without compromise of the canal. No other fracture or mass lesion  Scoliosis and multilevel degenerative change throughout the lumbar spine.   Electronically Signed   By: Marlan Palau M.D.   On: 01/27/2014 13:43   Mr Lumbar Spine Wo Contrast  01/29/2014   CLINICAL DATA:  Evaluate L2 compression fracture.  Fall.  EXAM: MRI LUMBAR SPINE WITHOUT CONTRAST  TECHNIQUE: Multiplanar, multisequence MR imaging was performed. No intravenous contrast was administered.  COMPARISON:  CT lumbar spine 01/27/2014  FINDINGS: There is a mild L2 compression fracture with approximately 20% height loss. There is mild marrow edema. Vertebral body heights are preserved elsewhere. There is mild lumbar levoscoliosis. There is no listhesis. The conus medullaris is normal in  signal and terminates at the L2-3 disc space level. Multilevel disc desiccation is seen throughout the lumbar spine. There is moderate disc space narrowing at L4-5. 1.4 cm T2 hyperintense lesion arising from the anterior interpolar left kidney likely represents a cyst.  T12-L1:  Negative.  L1-2: Minimal disc bulge and mild facet and ligamentum flavum hypertrophy without stenosis.  L2-3: Mild disc bulge and facet and ligamentum flavum hypertrophy result in mild right lateral recess narrowing and moderate right neural foraminal stenosis. No spinal canal stenosis.  L3-4: Mild disc bulge and facet and ligamentum flavum hypertrophy result in mild bilateral lateral recess narrowing without spinal canal or neural foraminal stenosis.  L4-5: Circumferential disc bulge with superimposed left central disc extrusion and moderate facet and ligamentum flavum hypertrophy result in left greater than right lateral recess stenosis, mild spinal stenosis, and mild right and moderate to severe left neural foraminal stenosis.  L5-S1: Moderate facet hypertrophy without spinal canal or neural foraminal stenosis.  IMPRESSION: 1. Mild L2 compression fracture with mild marrow edema, likely acute. 2. Multilevel degenerative disc disease and facet arthrosis, worst at L4-5 where there is mild spinal stenosis and moderate to severe left lateral recess and left neural foraminal stenosis.   Electronically Signed   By: Sebastian AcheAllen  Grady   On: 01/29/2014 08:56   Dg Shoulder Left  01/27/2014   CLINICAL DATA:  Fall  EXAM: LEFT SHOULDER - 2+ VIEW  COMPARISON:  None.  FINDINGS: Negative for fracture. Chronic rotator cuff tear. Degenerative change in the shoulder joint and AC joint.  IMPRESSION: Negative for fracture.   Electronically Signed   By: Marlan Palauharles  Clark M.D.   On: 01/27/2014 09:14    Medications:  I have reviewed the patient's current medications. Scheduled: . calcium carbonate  1,250 mg Oral Q breakfast  . cefTRIAXone (ROCEPHIN)  IV  1 g  Intravenous Q24H  . citalopram  10 mg Oral QHS  . donepezil  10 mg Oral Daily  . levothyroxine  12.5 mcg Oral QAC breakfast  . metFORMIN  500 mg Oral BID WC  . oxybutynin  10 mg Oral Daily  . polyethylene glycol  17 g Oral Daily  . senna  1 tablet Oral BID   Continuous: . 0.9 % NaCl with KCl 20 mEq / L Stopped (01/28/14 2008)  . heparin 1,000 Units/hr (01/28/14 2141)   ZOX:[id number]:acetaminophen, acetaminophen, alum & mag hydroxide-simeth, bisacodyl, HYDROmorphone (DILAUDID) injection, magnesium hydroxide, ondansetron (ZOFRAN) IV, ondansetron  Assessment/Plan: #1 Acute Compression Fracture: pain is stable when supine.  Plan for IR to treat on Monday. #2 Afib: stable and will stop pradaxa and start heparin if vertebroplasty recommended., Pharmacy managing. #3 DM2: Stable on po meds    LOS: 2 days   TISOVEC,RICHARD W 01/29/2014, 9:02 AM

## 2014-01-29 NOTE — Progress Notes (Signed)
ANTICOAGULATION CONSULT NOTE - Follow up  Pharmacy Consult for IV Heparin Indication: atrial fibrillation  Allergies  Allergen Reactions  . Codeine Hives, Itching and Rash    Patient Measurements: Weight: 154 lb (69.854 kg) Heparin Dosing Weight: actual weight ~70 kg  Vital Signs: Temp: 99.4 F (37.4 C) (03/07 0544) Temp src: Oral (03/07 0544) BP: 157/84 mmHg (03/07 0544) Pulse Rate: 102 (03/07 0544)  Labs:  Recent Labs  01/27/14 0918 01/27/14 2002 01/28/14 0405 01/29/14 0410  HGB 13.9 13.1 11.6* 11.3*  HCT 41.0 38.6 34.7* 35.2*  PLT  --  213 204 198  HEPARINUNFRC  --   --   --  0.19*  CREATININE 0.90 0.79 0.92  --     The CrCl is unknown because both a height and weight (above a minimum accepted value) are required for this calculation.  Assessment: [age] yoF with L2 compression fracture awaiting IR eval for possible vertebroplasty / kyphoplasty.  Has been on Pradaxa 150 mg daily for atrial fibrillation (daily dosing vs BID dosing due to advanced age).  Plan is to discontinue Pradaxa and start IV heparin while awaiting possible vertebroplasty or kyphoplasty on Monday.   Heparin level is below therapeutic range on 1000 units/hr.  CBC ok. No bleeding reported/documented.  Goal of Therapy:  Heparin level 0.3-0.7 units/ml Monitor platelets by anticoagulation protocol: Yes   Plan:   Increase IV heparin to 1250 units/hr.  Check heparin level in 6 hrs.  Daily HL and CBC while on heparin.  F/u plan for procedure and plan for management of anticoagulation pre- and post-operatively.  Charolotte Ekeom Taim Wurm, PharmD, pager [phone]. 01/29/2014,9:28 AM.

## 2014-01-30 ENCOUNTER — Encounter (HOSPITAL_COMMUNITY): Payer: Self-pay | Admitting: Radiology

## 2014-01-30 LAB — CBC
HCT: 32.7 % — ABNORMAL LOW (ref 36.0–46.0)
Hemoglobin: 11.1 g/dL — ABNORMAL LOW (ref 12.0–15.0)
MCH: 28.3 pg (ref 26.0–34.0)
MCHC: 33.9 g/dL (ref 30.0–36.0)
MCV: 83.4 fL (ref 78.0–100.0)
PLATELETS: 190 10*3/uL (ref 150–400)
RBC: 3.92 MIL/uL (ref 3.87–5.11)
RDW: 14.6 % (ref 11.5–15.5)
WBC: 7.6 10*3/uL (ref 4.0–10.5)

## 2014-01-30 LAB — HEPARIN LEVEL (UNFRACTIONATED)
HEPARIN UNFRACTIONATED: 0.33 [IU]/mL (ref 0.30–0.70)
Heparin Unfractionated: 0.76 IU/mL — ABNORMAL HIGH (ref 0.30–0.70)
Heparin Unfractionated: 0.32 IU/mL (ref 0.30–0.70)

## 2014-01-30 LAB — GLUCOSE, CAPILLARY: Glucose-Capillary: 132 mg/dL — ABNORMAL HIGH (ref 70–99)

## 2014-01-30 MED ORDER — CEFAZOLIN SODIUM-DEXTROSE 2-3 GM-% IV SOLR
2.0000 g | Freq: Once | INTRAVENOUS | Status: DC
  Filled 2014-01-30: qty 50

## 2014-01-30 MED ORDER — HEPARIN (PORCINE) IN NACL 100-0.45 UNIT/ML-% IJ SOLN
950.0000 [IU]/h | INTRAMUSCULAR | Status: DC
  Administered 2014-01-30 (×2): 950 [IU]/h via INTRAVENOUS
  Filled 2014-01-30 (×3): qty 250

## 2014-01-30 NOTE — Progress Notes (Signed)
Call placed to [facility] for pt pickup in AM. Pt will be picked up at 8:00 in AM per [facility] and taken to [facility] for kyphoplasty. Vwilliams,rn.

## 2014-01-30 NOTE — Progress Notes (Signed)
ANTICOAGULATION CONSULT NOTE - Follow up  Pharmacy Consult for IV Heparin Indication: atrial fibrillation  Allergies  Allergen Reactions  . Codeine Hives, Itching and Rash    Patient Measurements: Weight: 154 lb (69.854 kg) Heparin Dosing Weight: actual weight ~70 kg  Vital Signs: Temp: 98.4 F (36.9 C) (03/08 0530) Temp src: Oral (03/08 0530) BP: 131/87 mmHg (03/08 0530) Pulse Rate: 82 (03/08 0530)  Labs:  Recent Labs  01/27/14 2002 01/28/14 0405  01/29/14 0410 01/29/14 1545 01/30/14 0054 01/30/14 1048  HGB 13.1 11.6*  --  11.3*  --  11.1*  --   HCT 38.6 34.7*  --  35.2*  --  32.7*  --   PLT 213 204  --  198  --  190  --   HEPARINUNFRC  --   --   < > 0.19* 0.78* 0.76* 0.33  CREATININE 0.79 0.92  --   --   --   --   --   < > = values in this interval not displayed.  The CrCl is unknown because both a height and weight (above a minimum accepted value) are required for this calculation.  Assessment: [age] yoF with L2 compression fracture awaiting IR eval for possible vertebroplasty / kyphoplasty.  Has been on Pradaxa 150 mg daily for atrial fibrillation (daily dosing vs BID dosing due to advanced age).  Plan is to discontinue Pradaxa and start IV heparin while awaiting possible vertebroplasty or kyphoplasty on Monday.   Heparin level is in therapeutic range on 950units/hr.  CBC ok. No bleeding reported/documented.  Goal of Therapy:  Heparin level 0.3-0.7 units/ml Monitor platelets by anticoagulation protocol: Yes   Plan:   Cont heparin at 950 units/hr.  Recheck heparin level in 6 hours.  Daily HL and CBC while on heparin.  Heparin ordered to stop at 0700 on 3/9 for kyphoplasty at [facility] radiology.  Charolotte Ekeom Jassmine Vandruff, PharmD, pager [phone]. 01/30/2014,12:15 PM.

## 2014-01-30 NOTE — Progress Notes (Signed)
Subjective: No events yesterday.  Still some pain, appetite good.  Objective: Vital signs in last 24 hours: Temp:  [97.8 F (36.6 C)-98.8 F (37.1 C)] 98.4 F (36.9 C) (03/08 0530) Pulse Rate:  [82-89] 82 (03/08 0530) Resp:  [17-18] 18 (03/08 0530) BP: (109-132)/(70-87) 131/87 mmHg (03/08 0530) SpO2:  [93 %-96 %] 95 % (03/08 0530) Weight change:  Last BM Date: 01/26/14  Intake/Output from previous day: 03/07 0701 - 03/08 0700 In: 902.5 [P.O.:360; I.V.:252.5; IV Piggyback:50] Out: -  Intake/Output this shift:   General appearance: alert, cooperative and no distress  Resp: clear to auscultation bilaterally  Cardio: regular rate and rhythm and irregularly irregular rhythm  GI: soft, non-tender; bowel sounds normal; no masses, no organomegaly   Lab Results:  Recent Labs  01/29/14 0410 01/30/14 0054  WBC 6.7 7.6  HGB 11.3* 11.1*  HCT 35.2* 32.7*  PLT 198 190   BMET  Recent Labs  01/27/14 0918 01/27/14 2002 01/28/14 0405  NA 139  --  139  K 3.5*  --  3.7  CL 101  --  101  CO2  --   --  25  GLUCOSE 139*  --  152*  BUN 18  --  18  CREATININE 0.90 0.79 0.92  CALCIUM  --   --  9.1    Studies/Results: Mr Lumbar Spine Wo Contrast  01/29/2014   CLINICAL DATA:  Evaluate L2 compression fracture.  Fall.  EXAM: MRI LUMBAR SPINE WITHOUT CONTRAST  TECHNIQUE: Multiplanar, multisequence MR imaging was performed. No intravenous contrast was administered.  COMPARISON:  CT lumbar spine 01/27/2014  FINDINGS: There is a mild L2 compression fracture with approximately 20% height loss. There is mild marrow edema. Vertebral body heights are preserved elsewhere. There is mild lumbar levoscoliosis. There is no listhesis. The conus medullaris is normal in signal and terminates at the L2-3 disc space level. Multilevel disc desiccation is seen throughout the lumbar spine. There is moderate disc space narrowing at L4-5. 1.4 cm T2 hyperintense lesion arising from the anterior interpolar left  kidney likely represents a cyst.  T12-L1:  Negative.  L1-2: Minimal disc bulge and mild facet and ligamentum flavum hypertrophy without stenosis.  L2-3: Mild disc bulge and facet and ligamentum flavum hypertrophy result in mild right lateral recess narrowing and moderate right neural foraminal stenosis. No spinal canal stenosis.  L3-4: Mild disc bulge and facet and ligamentum flavum hypertrophy result in mild bilateral lateral recess narrowing without spinal canal or neural foraminal stenosis.  L4-5: Circumferential disc bulge with superimposed left central disc extrusion and moderate facet and ligamentum flavum hypertrophy result in left greater than right lateral recess stenosis, mild spinal stenosis, and mild right and moderate to severe left neural foraminal stenosis.  L5-S1: Moderate facet hypertrophy without spinal canal or neural foraminal stenosis.  IMPRESSION: 1. Mild L2 compression fracture with mild marrow edema, likely acute. 2. Multilevel degenerative disc disease and facet arthrosis, worst at L4-5 where there is mild spinal stenosis and moderate to severe left lateral recess and left neural foraminal stenosis.   Electronically Signed   By: Sebastian Ache   On: 01/29/2014 08:56    Medications:  I have reviewed the patient's current medications. Scheduled: . calcium carbonate  1,250 mg Oral Q breakfast  . cefTRIAXone (ROCEPHIN)  IV  1 g Intravenous Q24H  . citalopram  10 mg Oral QHS  . donepezil  10 mg Oral Daily  . levothyroxine  12.5 mcg Oral QAC breakfast  . metFORMIN  500 mg Oral BID WC  . oxybutynin  10 mg Oral Daily  . polyethylene glycol  17 g Oral Daily  . senna  1 tablet Oral BID   Continuous: . 0.9 % NaCl with KCl 20 mEq / L Stopped (01/28/14 2008)  . heparin 950 Units/hr (01/30/14 0200)   ZOX:[id number]:acetaminophen, acetaminophen, alum & mag hydroxide-simeth, bisacodyl, HYDROmorphone (DILAUDID) injection, magnesium hydroxide, ondansetron (ZOFRAN) IV,  ondansetron  Assessment/Plan: #1 Acute Compression Fracture: pain is stable when supine. Plan for IR to treat on Monday.  #2 Afib: stable and will stop pradaxa and start heparin if vertebroplasty recommended., Pharmacy managing.  #3 DM2: Stable on po meds    LOS: 3 days   Magnolia Mattila W 01/30/2014, 8:43 AM

## 2014-01-30 NOTE — H&P (Signed)
Chief Complaint: "Back pain." Referring Physician: Dr. Eloise Harman HPI: Dawn Bradshaw is an 80 y.o. female who has history of CVA, dementia, atrial fibrillation on pradaxa. She lives with her son Dawn Bradshaw who states recently on 3/5 he heard he scream and he walked into her room to see her walker pushed over and her up against the wall, CT head was performed in ED with no acute hemorrhage seen. He thinks she fell and is concerned for her safety lately with recent falls. The patient complains of "horrible" back pain when asked to lean forward or roll to the side for evaluation. She had a MRI which revealed acute lumbar level 2 compression fracture, she is currently receiving pain medication and her back pain persists. IR was asked to evaluation patient for a L2 VP/KP.   Past Medical History:  Past Medical History  Diagnosis Date  . Hyperlipidemia   . Thyroid disease     hypothyroidism  . Psoriasis   . Osteopenia   . LBP (low back pain)     mild  . Palpitations     H/O  . Diabetes mellitus     Type II  . Alzheimer's disease     mild  . Arrhythmia 4/12    A-Fib @ [facility]  . Atrial fibrillation   . Stroke   . Hypertension     Past Surgical History:  Past Surgical History  Procedure Laterality Date  . Cleft palate repair    . Myomectomy  1980  . Colonoscopy  1999  . Orif hip fracture  08/2002  . Cataract extraction  08/2008  . Femur im nail Right 10/01/2013    Procedure: INTRAMEDULLARY (IM) NAIL FEMORAL;  Surgeon: Nestor Lewandowsky, MD;  Location: [facility];  Service: Orthopedics;  Laterality: Right;    Family History:  Family History  Problem Relation Age of Onset  . Heart disease Brother     Social History:  reports that she quit smoking about 65 years ago. Her smoking use included Cigarettes. She smoked 0.00 packs per day. She has never used smokeless tobacco. She reports that she does not drink alcohol or use illicit drugs.  Allergies:  Allergies  Allergen Reactions  . Codeine  Hives, Itching and Rash    Medications:   Medication List    ASK your doctor about these medications       calcium carbonate 600 MG Tabs tablet  Commonly known as:  OS-CAL  Take 600 mg by mouth daily with breakfast.     citalopram 10 MG tablet  Commonly known as:  CELEXA  Take 10 mg by mouth at bedtime.     donepezil 10 MG tablet  Commonly known as:  ARICEPT  Take 10 mg by mouth daily.     losartan-hydrochlorothiazide 100-12.5 MG per tablet  Commonly known as:  HYZAAR  Take 1 tablet by mouth daily.     metFORMIN 500 MG tablet  Commonly known as:  GLUCOPHAGE  Take 500 mg by mouth 2 (two) times daily with a meal.     oxybutynin 5 MG 24 hr tablet  Commonly known as:  DITROPAN-XL  Take 10 mg by mouth daily.     PRADAXA 150 MG Caps capsule  Generic drug:  dabigatran  Take 150 mg by mouth daily.     SYNTHROID 25 MCG tablet  Generic drug:  levothyroxine  Take 12.5 mcg by mouth daily.       Please HPI for pertinent positives, otherwise complete 10 system  ROS negative.  Physical Exam: BP 131/87  Pulse 82  Temp(Src) 98.4 F (36.9 C) (Oral)  Resp 18  Wt 154 lb (69.854 kg)  SpO2 95% Body mass index is 24.11 kg/(m^2).  General Appearance:  Alert to self, but not location or time, cooperative, no distress  Head:  Normocephalic, without obvious abnormality, atraumatic  Neck: Supple, symmetrical, trachea midline  Lungs:   Clear to auscultation bilaterally, no w/r/r, respirations unlabored without use of accessory muscles.  Chest Wall:  No tenderness or deformity  Back: Pain over lumbar region to palpation.   Heart:  Regular rate and rhythm, S1, S2 normal, no murmur, rub or gallop.  Abdomen:   Soft, non-tender, non distended, (+) BS  Extremities: Extremities normal, atraumatic, no cyanosis or edema  Neurologic: Normal affect, no gross deficits.   Results for orders placed during the hospital encounter of 01/27/14 (from the past 48 hour(s))  HEPARIN LEVEL  (UNFRACTIONATED)     Status: Abnormal   Collection Time    01/29/14  4:10 AM      Result Value Ref Range   Heparin Unfractionated 0.19 (*) 0.30 - 0.70 IU/mL   Comment:            IF HEPARIN RESULTS ARE BELOW     EXPECTED VALUES, AND PATIENT     DOSAGE HAS BEEN CONFIRMED,     SUGGEST FOLLOW UP TESTING     OF ANTITHROMBIN III LEVELS.  CBC     Status: Abnormal   Collection Time    01/29/14  4:10 AM      Result Value Ref Range   WBC 6.7  4.0 - 10.5 K/uL   RBC 4.17  3.87 - 5.11 MIL/uL   Hemoglobin 11.3 (*) 12.0 - 15.0 g/dL   HCT 16.1 (*) 09.6 - 04.5 %   MCV 84.4  78.0 - 100.0 fL   MCH 27.1  26.0 - 34.0 pg   MCHC 32.1  30.0 - 36.0 g/dL   RDW 40.9  81.1 - 91.4 %   Platelets 198  150 - 400 K/uL  HEPARIN LEVEL (UNFRACTIONATED)     Status: Abnormal   Collection Time    01/29/14  3:45 PM      Result Value Ref Range   Heparin Unfractionated 0.78 (*) 0.30 - 0.70 IU/mL   Comment:            IF HEPARIN RESULTS ARE BELOW     EXPECTED VALUES, AND PATIENT     DOSAGE HAS BEEN CONFIRMED,     SUGGEST FOLLOW UP TESTING     OF ANTITHROMBIN III LEVELS.  HEPARIN LEVEL (UNFRACTIONATED)     Status: Abnormal   Collection Time    01/30/14 12:54 AM      Result Value Ref Range   Heparin Unfractionated 0.76 (*) 0.30 - 0.70 IU/mL   Comment:            IF HEPARIN RESULTS ARE BELOW     EXPECTED VALUES, AND PATIENT     DOSAGE HAS BEEN CONFIRMED,     SUGGEST FOLLOW UP TESTING     OF ANTITHROMBIN III LEVELS.  CBC     Status: Abnormal   Collection Time    01/30/14 12:54 AM      Result Value Ref Range   WBC 7.6  4.0 - 10.5 K/uL   RBC 3.92  3.87 - 5.11 MIL/uL   Hemoglobin 11.1 (*) 12.0 - 15.0 g/dL   HCT 78.2 (*) 95.6 - 21.3 %  MCV 83.4  78.0 - 100.0 fL   MCH 28.3  26.0 - 34.0 pg   MCHC 33.9  30.0 - 36.0 g/dL   RDW 16.114.6  09.611.5 - 04.515.5 %   Platelets 190  150 - 400 K/uL  GLUCOSE, CAPILLARY     Status: Abnormal   Collection Time    01/30/14  8:01 AM      Result Value Ref Range   Glucose-Capillary  132 (*) 70 - 99 mg/dL   Mr Lumbar Spine Wo Contrast  01/29/2014   CLINICAL DATA:  Evaluate L2 compression fracture.  Fall.  EXAM: MRI LUMBAR SPINE WITHOUT CONTRAST  TECHNIQUE: Multiplanar, multisequence MR imaging was performed. No intravenous contrast was administered.  COMPARISON:  CT lumbar spine 01/27/2014  FINDINGS: There is a mild L2 compression fracture with approximately 20% height loss. There is mild marrow edema. Vertebral body heights are preserved elsewhere. There is mild lumbar levoscoliosis. There is no listhesis. The conus medullaris is normal in signal and terminates at the L2-3 disc space level. Multilevel disc desiccation is seen throughout the lumbar spine. There is moderate disc space narrowing at L4-5. 1.4 cm T2 hyperintense lesion arising from the anterior interpolar left kidney likely represents a cyst.  T12-L1:  Negative.  L1-2: Minimal disc bulge and mild facet and ligamentum flavum hypertrophy without stenosis.  L2-3: Mild disc bulge and facet and ligamentum flavum hypertrophy result in mild right lateral recess narrowing and moderate right neural foraminal stenosis. No spinal canal stenosis.  L3-4: Mild disc bulge and facet and ligamentum flavum hypertrophy result in mild bilateral lateral recess narrowing without spinal canal or neural foraminal stenosis.  L4-5: Circumferential disc bulge with superimposed left central disc extrusion and moderate facet and ligamentum flavum hypertrophy result in left greater than right lateral recess stenosis, mild spinal stenosis, and mild right and moderate to severe left neural foraminal stenosis.  L5-S1: Moderate facet hypertrophy without spinal canal or neural foraminal stenosis.  IMPRESSION: 1. Mild L2 compression fracture with mild marrow edema, likely acute. 2. Multilevel degenerative disc disease and facet arthrosis, worst at L4-5 where there is mild spinal stenosis and moderate to severe left lateral recess and left neural foraminal stenosis.    Electronically Signed   By: Sebastian AcheAllen  Grady   On: 01/29/2014 08:56    Assessment/Plan Dementia. History of CVA. Atrial fibrillation on IV heparin, last dose of pradaxa 3/5. Lower back pain. S/p recent fall. MRI revealed acute Lumbar 2 compression fracture. Scheduled for image guided lumbar level 2 vertebroplasty/kyphoplasty Patient will be NPO after midnight, labs and images reviewed, IV heparin discontinued at 0700 for IR procedure.  Risks and Benefits discussed with the patient and her son Dawn Bradshaw. All questions were answered, patient and son are agreeable to proceed. Consent signed by the son/POA over the phone and is in chart. Orders placed to have RN arrange carelink to have the patient arrive at 0830 on 3/9 to [facility].   Pattricia BossMORGAN, Keiden Deskin D PA-C 01/30/2014, 10:57 AM

## 2014-01-30 NOTE — Progress Notes (Signed)
Pharmacy Consult Note: IV Heparin Follow Up  Labs: heparin level 0.32  A/P: heparin level therapeutic (goal 0.3-0.7) for afib and bridge for surgery on rate of 950 units/hr. Continue current rate and stop at 0700 as already planned for surgery tomorrow   Hessie KnowsJustin M Cyniah Gossard, PharmD, BCPS Pager [phone] 01/30/2014 7:43 PM

## 2014-01-30 NOTE — Progress Notes (Signed)
PT Cancellation Note  Patient Details Name: Dawn Bradshaw MRN: [id number] DOB: Sep 20, 1927   Cancelled Treatment:    Reason Eval/Treat Not Completed: pt declined OOB with PT today-prefers to wait until procedure. KP/VP scheduled for Monday. Will check back another day. Thanks.    Rebeca AlertJannie Kyrielle Bradshaw, MPT Pager: [phone]

## 2014-01-30 NOTE — Progress Notes (Signed)
ANTICOAGULATION CONSULT NOTE - Follow Up Consult  Pharmacy Consult for Heparin Indication: atrial fibrillation  Allergies  Allergen Reactions  . Codeine Hives, Itching and Rash    Patient Measurements: Weight: 154 lb (69.854 kg) Heparin Dosing Weight:   Vital Signs: Temp: 97.8 F (36.6 C) (03/07 2100) Temp src: Oral (03/07 2100) BP: 109/70 mmHg (03/07 2100) Pulse Rate: 89 (03/07 2100)  Labs:  Recent Labs  01/27/14 0918 01/27/14 2002 01/28/14 0405 01/29/14 0410 01/29/14 1545 01/30/14 0054  HGB 13.9 13.1 11.6* 11.3*  --  11.1*  HCT 41.0 38.6 34.7* 35.2*  --  32.7*  PLT  --  213 204 198  --  190  HEPARINUNFRC  --   --   --  0.19* 0.78* 0.76*  CREATININE 0.90 0.79 0.92  --   --   --     The CrCl is unknown because both a height and weight (above a minimum accepted value) are required for this calculation.   Medications:  Infusions:  . 0.9 % NaCl with KCl 20 mEq / L Stopped (01/28/14 2008)  . heparin      Assessment: Patient with high heparin level.  No issues per RN.  Goal of Therapy:  Heparin level 0.3-0.7 units/ml Monitor platelets by anticoagulation protocol: Yes   Plan:  Decrease heparin to 950 units/hr Recheck level at 1000  Darlina GuysGrimsley Jr, Jacquenette ShoneJulian Crowford 01/30/2014,1:25 AM

## 2014-01-31 ENCOUNTER — Ambulatory Visit (HOSPITAL_COMMUNITY): Payer: 59 | Attending: Internal Medicine

## 2014-01-31 ENCOUNTER — Ambulatory Visit (HOSPITAL_COMMUNITY)
Admit: 2014-01-31 | Discharge: 2014-01-31 | Disposition: A | Discharge status: Home / Self Care | Payer: Medicare PPO | Attending: Internal Medicine | Admitting: Internal Medicine

## 2014-01-31 LAB — CBC
HEMATOCRIT: 34.5 % — AB (ref 36.0–46.0)
Hemoglobin: 11.5 g/dL — ABNORMAL LOW (ref 12.0–15.0)
MCH: 27.8 pg (ref 26.0–34.0)
MCHC: 33.3 g/dL (ref 30.0–36.0)
MCV: 83.3 fL (ref 78.0–100.0)
PLATELETS: 245 10*3/uL (ref 150–400)
RBC: 4.14 MIL/uL (ref 3.87–5.11)
RDW: 14.3 % (ref 11.5–15.5)
WBC: 7.5 10*3/uL (ref 4.0–10.5)

## 2014-01-31 LAB — SURGICAL PCR SCREEN
MRSA, PCR: NEGATIVE
Staphylococcus aureus: NEGATIVE

## 2014-01-31 LAB — GLUCOSE, CAPILLARY: Glucose-Capillary: 123 mg/dL — ABNORMAL HIGH (ref 70–99)

## 2014-01-31 LAB — HEPARIN LEVEL (UNFRACTIONATED): Heparin Unfractionated: 0.25 IU/mL — ABNORMAL LOW (ref 0.30–0.70)

## 2014-01-31 MED ORDER — CEFAZOLIN SODIUM-DEXTROSE 2-3 GM-% IV SOLR
2.0000 g | Freq: Once | INTRAVENOUS | Status: AC
  Administered 2014-01-31: 2 g via INTRAVENOUS

## 2014-01-31 MED ORDER — FENTANYL CITRATE 0.05 MG/ML IJ SOLN
INTRAMUSCULAR | Status: AC | PRN
  Administered 2014-01-31 (×4): 25 ug via INTRAVENOUS

## 2014-01-31 MED ORDER — HEPARIN (PORCINE) IN NACL 100-0.45 UNIT/ML-% IJ SOLN
1050.0000 [IU]/h | INTRAMUSCULAR | Status: AC
  Filled 2014-01-31: qty 250

## 2014-01-31 MED ORDER — FENTANYL CITRATE 0.05 MG/ML IJ SOLN
INTRAMUSCULAR | Status: AC
  Filled 2014-01-31: qty 4

## 2014-01-31 MED ORDER — MIDAZOLAM HCL 2 MG/2ML IJ SOLN
INTRAMUSCULAR | Status: AC | PRN
  Administered 2014-01-31 (×3): 0.5 mg via INTRAVENOUS

## 2014-01-31 MED ORDER — HYDROMORPHONE HCL PF 1 MG/ML IJ SOLN
INTRAMUSCULAR | Status: AC
  Filled 2014-01-31: qty 1

## 2014-01-31 MED ORDER — MIDAZOLAM HCL 2 MG/2ML IJ SOLN
INTRAMUSCULAR | Status: AC
  Filled 2014-01-31: qty 4

## 2014-01-31 NOTE — Sedation Documentation (Signed)
Care [facility] called by Phillis KnackPeggy Troha, RN

## 2014-01-31 NOTE — Sedation Documentation (Signed)
O2 started 2 L/ [place]

## 2014-01-31 NOTE — Progress Notes (Signed)
UR completed. Maleaha Hughett RN CCM Case Mgmt phone [phone] 

## 2014-01-31 NOTE — Sedation Documentation (Signed)
Back onto stretcher, in Rad nurses station.  O2 d/c'd.  VSS

## 2014-01-31 NOTE — Sedation Documentation (Addendum)
Care link here, report callec to [facility] 6 E RN.  Ice pack in place.

## 2014-01-31 NOTE — Procedures (Signed)
L2 KP No comp

## 2014-01-31 NOTE — Progress Notes (Signed)
PHARMACY - ANTICOAGULATION:  BRIEF NOTE  Patient has returned from [facility]. Heparin infusion remains off.  Contacted Interventional Radiology for guidance on when anticoagulation can be safely resumed.  The following instructions were provided by Pattricia BossKoreen Morgan, PA-C:  1. No Pradaxa until tomorrow (02/01/14), then can resume if attending MD orders.  2. If attending MD wishes to resume heparin infusion for anticoagulant bridging until safe to restart Pradaxa this would be acceptable, but NO BOLUS.   Called Dr. Silvano RuskPaterson's office, left the above information with office nurse, asked for call back regarding whether to resume heparin infusion this PM.  Elie Goodyandy Lyris Hitchman, PharmD, BCPS Pager: [phone] 01/31/2014  3:10 PM  ADDENDUM: Received call back from Dr. Silvano RuskPaterson's office.   His instructions are to not resume heparin today.   He will order Pradaxa to resume tomorrow.  Elie Goodyandy Chibueze Beasley, PharmD, BCPS Pager: [phone] 01/31/2014  3:29 PM

## 2014-01-31 NOTE — Progress Notes (Signed)
PHARMACY - ANTICOAGULATION:  BRIEF NOTE  Patient has returned from [facility]. Heparin infusion remains off.  Contacted Interventional Radiology for guidance on when anticoagulation can be safely resumed.  The following instructions were provided by Pattricia BossKoreen Morgan, PA-C:  1. No Pradaxa until tomorrow (02/01/14), then can resume if attending MD orders.  2. If attending MD wishes to resume heparin infusion for anticoagulant bridging until safe to restart Pradaxa this would be acceptable, but NO BOLUS.   Called Dr. Silvano RuskPaterson's office, left the above information with office nurse, asked for call back regarding whether to resume heparin infusion this PM.  Elie Goodyandy Tequisha Maahs, PharmD, BCPS Pager: [phone] 01/31/2014  3:10 PM

## 2014-01-31 NOTE — Progress Notes (Signed)
Subjective: No back pain lying supine, extreme back pain when she sits up  Objective: Vital signs in last 24 hours: Temp:  [97.8 F (36.6 C)-98.5 F (36.9 C)] 97.8 F (36.6 C) (03/09 0512) Pulse Rate:  [78-90] 78 (03/09 0512) Resp:  [16-18] 16 (03/09 0512) BP: (129-172)/(88-96) 166/96 mmHg (03/09 0512) SpO2:  [96 %] 96 % (03/09 0512) Weight change:    Intake/Output from previous day: 03/08 0701 - 03/09 0700 In: 840.5 [P.O.:360; I.V.:230.5; IV Piggyback:50] Out: -    General appearance: alert, cooperative and no distress she answered questions appropriately to transport staff getting ready [facility]  Lab Results:  Recent Labs  01/30/14 0054 01/31/14 0410  WBC 7.6 7.5  HGB 11.1* 11.5*  HCT 32.7* 34.5*  PLT 190 245   BMET No results found for this basename: NA, K, CL, CO2, GLUCOSE, BUN, CREATININE, CALCIUM,  in the last 72 hours CMET CMP     Component Value Date/Time   NA 139 01/28/2014 0405   K 3.7 01/28/2014 0405   CL 101 01/28/2014 0405   CO2 25 01/28/2014 0405   GLUCOSE 152* 01/28/2014 0405   BUN 18 01/28/2014 0405   CREATININE 0.92 01/28/2014 0405   CALCIUM 9.1 01/28/2014 0405   PROT 5.6* 01/28/2014 0405   ALBUMIN 2.7* 01/28/2014 0405   AST 13 01/28/2014 0405   ALT 10 01/28/2014 0405   ALKPHOS 76 01/28/2014 0405   BILITOT 0.6 01/28/2014 0405   GFRNONAA 56* 01/28/2014 0405   GFRAA 64* 01/28/2014 0405    CBG (last 3)   Recent Labs  01/30/14 0801  GLUCAP 132*    INR RESULTS:   Lab Results  Component Value Date   INR 1.12 10/01/2013     Studies/Results: No results found.  Medications: I have reviewed the patient's current medications.  Assessment/Plan: #1 L2 compression fracture: stable awaiting IR procedure today for intractable pain. Likely discharge to a SNF tomorrow. #2 Anemia: stable on current meds #2 Atrial Fibrillation: stable on current meds with a heparin bridge for now and can restart pradaxa tomorrow, if  IR physician in agreement.    LOS: 4 days   Salina Stanfield G 01/31/2014, 7:56 AM

## 2014-01-31 NOTE — Progress Notes (Signed)
ANTICOAGULATION CONSULT NOTE - Follow Up Consult  Pharmacy Consult for Heparin Indication: atrial fibrillation  Allergies  Allergen Reactions  . Codeine Hives, Itching and Rash    Patient Measurements: Weight: 154 lb (69.854 kg) Heparin Dosing Weight:   Vital Signs: Temp: 97.8 F (36.6 C) (03/09 0512) Temp src: Oral (03/09 0512) BP: 166/96 mmHg (03/09 0512) Pulse Rate: 78 (03/09 0512)  Labs:  Recent Labs  01/29/14 0410  01/30/14 0054 01/30/14 1048 01/30/14 1758 01/31/14 0410  HGB 11.3*  --  11.1*  --   --  11.5*  HCT 35.2*  --  32.7*  --   --  34.5*  PLT 198  --  190  --   --  245  HEPARINUNFRC 0.19*  < > 0.76* 0.33 0.32 0.25*  < > = values in this interval not displayed.  The CrCl is unknown because both a height and weight (above a minimum accepted value) are required for this calculation.   Medications:  Infusions:  . 0.9 % NaCl with KCl 20 mEq / L Stopped (01/28/14 2008)  . heparin 1,050 Units/hr (01/31/14 0527)    Assessment: Patient with low heparin level.  No issues per RN.  Heparin to be stopped at 0700.  Goal of Therapy:  Heparin level 0.3-0.7 units/ml Monitor platelets by anticoagulation protocol: Yes   Plan:  Increase heparin to 1050 units/hr, d/c at 0700 per prior order. follow up with anticoag plan  Aleene DavidsonGrimsley Jr, Jeyden Coffelt Crowford 01/31/2014,5:31 AM

## 2014-01-31 NOTE — Progress Notes (Signed)
[facility] Tuesday if pt is ready for d/c. CSW will continue to follow to assist with d/c.  Cori RazorJamie Sharod Petsch LCSW [phone]

## 2014-02-01 DIAGNOSIS — E46 Unspecified protein-calorie malnutrition: Secondary | ICD-10-CM | POA: Diagnosis present

## 2014-02-01 DIAGNOSIS — D649 Anemia, unspecified: Secondary | ICD-10-CM | POA: Diagnosis present

## 2014-02-01 LAB — CBC
HCT: 34.4 % — ABNORMAL LOW (ref 36.0–46.0)
Hemoglobin: 11.5 g/dL — ABNORMAL LOW (ref 12.0–15.0)
MCH: 27.8 pg (ref 26.0–34.0)
MCHC: 33.4 g/dL (ref 30.0–36.0)
MCV: 83.1 fL (ref 78.0–100.0)
PLATELETS: 236 10*3/uL (ref 150–400)
RBC: 4.14 MIL/uL (ref 3.87–5.11)
RDW: 14.4 % (ref 11.5–15.5)
WBC: 6.9 10*3/uL (ref 4.0–10.5)

## 2014-02-01 LAB — GLUCOSE, CAPILLARY: Glucose-Capillary: 114 mg/dL — ABNORMAL HIGH (ref 70–99)

## 2014-02-01 MED ORDER — ACETAMINOPHEN 325 MG PO TABS: 650.0000 mg | ORAL_TABLET | Freq: Four times a day (QID) | ORAL | Status: DC | PRN

## 2014-02-01 MED ORDER — AMLODIPINE BESYLATE 2.5 MG PO TABS: 2.5000 mg | ORAL_TABLET | Freq: Every day | ORAL | Status: DC

## 2014-02-01 MED ORDER — SENNA 8.6 MG PO TABS: 1.0000 | ORAL_TABLET | Freq: Two times a day (BID) | ORAL | Status: DC

## 2014-02-01 MED ORDER — POLYETHYLENE GLYCOL 3350 17 G PO PACK: 17.0000 g | PACK | Freq: Every day | ORAL | Status: DC

## 2014-02-01 NOTE — Progress Notes (Signed)
Subjective: Pt sitting up in chair; states back pain has improved some since KP yesterday  Objective: Vital signs in last 24 hours: Temp:  [97.3 F (36.3 C)-98.5 F (36.9 C)] 97.5 F (36.4 C) (03/10 0617) Pulse Rate:  [60-100] 60 (03/10 0617) Resp:  [14-18] 18 (03/10 0617) BP: (103-163)/(56-103) 159/103 mmHg (03/10 0617) SpO2:  [93 %-98 %] 94 % (03/10 0617) Last BM Date: 01/30/14  Intake/Output from previous day:   Intake/Output this shift: Total I/O In: 240 [P.O.:240] Out: 300 [Urine:300]  Pt moving all fours ok; no new neurological changes; puncture site L2 region clean and dry, mildly tender  Lab Results:   Recent Labs  01/31/14 0410 02/01/14 0458  WBC 7.5 6.9  HGB 11.5* 11.5*  HCT 34.5* 34.4*  PLT 245 236   BMET No results found for this basename: NA, K, CL, CO2, GLUCOSE, BUN, CREATININE, CALCIUM,  in the last 72 hours PT/INR No results found for this basename: LABPROT, INR,  in the last 72 hours ABG No results found for this basename: PHART, PCO2, PO2, HCO3,  in the last 72 hours  Studies/Results: Ir Kypho Vertebral Lumbar Augmentation  01/31/2014   CLINICAL DATA:  L2 compression fracture.  Intractable pain.  EXAM: L2 KYPHOPLASTY  MEDICATIONS AND MEDICAL HISTORY: Versed 1.5 mg, Fentanyl 100 mcg.  As antibiotic prophylaxis, Ancef was ordered pre-procedure and administered intravenously within one hour of incision.  ANESTHESIA/SEDATION: Moderate sedation time: 21 minutes  CONTRAST:  None  FLUOROSCOPY TIME:  7 min and 42 seconds.  PROCEDURE: The procedure, risks, benefits, and alternatives were explained to the patient. Questions regarding the procedure were encouraged and answered. The patient understands and consents to the procedure.  The back was prepped with Betadine in a sterile fashion, and a sterile drape was applied covering the operative field. A sterile gown and sterile gloves were used for the procedure.  Under fluoroscopic guidance, a 10 gauge needle was  inserted into the L2 vertebral body via a right transpedicular approach. A bone drill was inserted into the after cannula and into the anterior third of the vertebral body, crossing the midline. It was exchanged for the 1.5 cm balloon. It was insufflated to 200 lb per sq range. It was exchanged for the cement cannula. Cement was injected crossing the midline and extending from the inferior to superior endplate.  No extra osseous cement was noted. After cement hardening, the patient was transferred to her bed without complication.  FINDINGS: Images demonstrate needle placement and balloon placement in the L2 vertebral body via a right transpedicular approach. The final image demonstrates cement filling the vertebral body, crossing the midline, and extending from the inferior to superior end plate. No extra osseous cement.  IMPRESSION: Successful L2 kyphoplasty.   Electronically Signed   By: Maryclare BeanArt  Hoss M.D.   On: 01/31/2014 11:53    Anti-infectives: Anti-infectives   Start     Dose/Rate Route Frequency Ordered Stop   01/31/14 0600  ceFAZolin (ANCEF) IVPB 2 g/50 mL premix     2 g 100 mL/hr over 30 Minutes Intravenous  Once 01/30/14 1113     01/28/14 1400  cefTRIAXone (ROCEPHIN) 1 g in dextrose 5 % 50 mL IVPB     1 g 100 mL/hr over 30 Minutes Intravenous Every 24 hours 01/28/14 0736     01/27/14 1245  cefTRIAXone (ROCEPHIN) 1 g in dextrose 5 % 50 mL IVPB     1 g 100 mL/hr over 30 Minutes Intravenous  Once 01/27/14 1236  01/27/14 1423      Assessment/Plan: S/p L2 KP 3/9; stable; plans as per Dr. Eloise Harman  LOS: 5 days    Dawn Bradshaw,D [facility] 02/01/2014

## 2014-02-01 NOTE — Progress Notes (Signed)
Clinical Social Work Department CLINICAL SOCIAL WORK PLACEMENT NOTE 02/01/2014  Patient:  Dawn Bradshaw,Dawn Bradshaw  Account Number:  [id number] Admit date:  01/27/2014  Clinical Social Worker:  Unk LightningHOLLY GERBER, LCSW  Date/time:  01/27/2014 01:00 PM  Clinical Social Work is seeking post-discharge placement for this patient at the following level of care:   SKILLED NURSING   (*CSW will update this form in Epic as items are completed)   01/27/2014  Patient/family provided with [facility] Department of Clinical Social Work's list of facilities offering this level of care within the geographic area requested by the patient (or if unable, by the patient's family).  01/27/2014  Patient/family informed of their freedom to choose among providers that offer the needed level of care, that participate in Medicare, Medicaid or managed care program needed by the patient, have an available bed and are willing to accept the patient.  01/27/2014  Patient/family informed of [facility]' ownership interest in [facility], as well as of the fact that they are under no obligation to receive care at this facility.  PASARR submitted to EDS on  PASARR number received from EDS on   FL2 transmitted to all facilities in geographic area requested by pt/family on   FL2 transmitted to all facilities within larger geographic area on   Patient informed that his/her managed care company has contracts with or will negotiate with  certain facilities, including the following:     Patient/family informed of bed offers received:  01/28/2014 [facility] Physician recommends and patient chooses bed at    Patient to [facility] on  02/01/2014 Patient to be transferred to facility by [facility]  The following physician request were entered in Epic:   Additional Comments:   [employer] provided auth for SNF placement.  Cori RazorJamie Anan Dapolito LCSW [phone]

## 2014-02-01 NOTE — Evaluation (Signed)
Physical Therapy Evaluation Patient Details Name: Dawn Bradshaw MRN: [id number] DOB: [birth year] Today's Date: 02/01/2014 Time: [id number] PT Time Calculation (min): 24 min  PT Assessment / Plan / Recommendation History of Present Illness   Pt with L2 comp fx s/p fall  Clinical Impression  Will benefit from PT to address deficits below; Recommend SNF; pt has been in bed for 5 days, very weak and requires incr time to complete all functional tasks; stil with fair amount of back pain with mobility, RN aware    PT Assessment  Patient needs continued PT services    Follow Up Recommendations  SNF    Does the patient have the potential to tolerate intense rehabilitation      Barriers to Discharge        Equipment Recommendations  None recommended by PT    Recommendations for Other Services     Frequency Min 3X/week    Precautions / Restrictions Precautions Precautions: Fall;Back   Pertinent Vitals/Pain Pt dizzy with sitting, BP 138/80;      Mobility  Bed Mobility Overal bed mobility: +2 for physical assistance;Needs Assistance Bed Mobility: Rolling;Sidelying to Sit Rolling: +2 for physical assistance;Max assist Sidelying to sit: +2 for physical assistance;Max assist;Total assist General bed mobility comments: cues and assist for back precautions Transfers Overall transfer level: Needs assistance Equipment used: Rolling walker (2 wheeled) Transfers: Sit to/from BJ'sStand;Stand Pivot Transfers Sit to Stand: +2 physical assistance;Mod assist Stand pivot transfers: +2 physical assistance;Mod assist General transfer comment: cues for hand placement, wt shift, safety Ambulation/Gait Ambulation/Gait assistance: Mod assist;+2 physical assistance Ambulation Distance (Feet): 6 Feet Assistive device: Rolling walker (2 wheeled) Gait Pattern/deviations: Step-to pattern;Trunk flexed;Wide base of support Gait velocity: decr General Gait Details: cues for postuer, RW position, step  length    Exercises     PT Diagnosis: Difficulty walking;Generalized weakness  PT Problem List: Decreased strength;Decreased activity tolerance;Decreased balance;Decreased mobility;Decreased knowledge of precautions;Decreased safety awareness;Decreased knowledge of use of DME PT Treatment Interventions: DME instruction;Gait training;Functional mobility training;Therapeutic activities;Therapeutic exercise;Patient/family education     PT Goals(Current goals can be found in the care plan section) Acute Rehab PT Goals Patient Stated Goal: none stated PT Goal Formulation: With patient Time For Goal Achievement: 02/08/14 Potential to Achieve Goals: Good  Visit Information  Last PT Received On: 02/01/14 Assistance Needed: +2       Prior Functioning  Home Living Family/patient expects to be discharged to:: Skilled nursing facility Prior Function Comments: Per notes in chart pt mobilized with RW but was not using it at time of fall Communication Communication: No difficulties    Cognition  Cognition Arousal/Alertness: Awake/alert Behavior During Therapy: WFL for tasks assessed/performed Overall Cognitive Status: Difficult to assess Memory: Decreased recall of precautions;Decreased short-term memory    Extremity/Trunk Assessment Upper Extremity Assessment Upper Extremity Assessment: Generalized weakness Lower Extremity Assessment Lower Extremity Assessment: Generalized weakness   Balance Balance Overall balance assessment: Needs assistance;History of Falls Sitting-balance support: Feet supported;Bilateral upper extremity supported Sitting balance-Leahy Scale: Fair Postural control: Posterior lean  End of Session PT - End of Session Equipment Utilized During Treatment: Gait belt Activity Tolerance: Patient limited by fatigue;Patient limited by pain Patient left: in chair Nurse Communication: Mobility status  GP     [facility],Shir Bergman 02/01/2014, 9:48 AM

## 2014-02-01 NOTE — Discharge Summary (Signed)
Physician Discharge Summary  Patient ID: Dawn Bradshaw MRN: [id number] DOB/AGE: 78/26/1928 78 y.o.  Admit date: 01/27/2014 Discharge date: 02/01/2014   Discharge Diagnoses:  Principal Problem:   Compression fracture of L2 Active Problems:   Acute low back pain   Gait instability   Compression fracture of L2 lumbar vertebra   Unspecified protein-calorie malnutrition   Atrial fibrillation   Dementia   Anemia   Discharged Condition: good  Hospital Course: The patient is an 80 year old Caucasian woman with multiple medical problems who is well-known to me. This past Fall she had a fall leading to right hip fracture with open reduction and internal fixation followed by a skilled nursing facility rehabilitation. She returned home last month and initially was doing well. She has 24-hour care at her home that is provided by relatives. She has had a reversed sleep and wake cycle, however. Last night, in the early hours of the morning she got up to go to the bathroom and had a fall onto her back with resultant moderate back pain and inability to walk. Generally she uses a walker to ambulate when she remembers to use it. She was brought to the emergency room with a workup showed bacteriuria and an x-ray of the lumbar spine with an L2 compression fracture of indeterminate age. Subsequently, a CT scan of the lumbar spine was done which showed evidence of an acute L2 compression fracture. In the ER attempts were made to have her stand up with a walker and assistance which were unsuccessful because of moderately severe low back pain. She is being admitted for further treatment of her compression fracture and management of her pain. She has not had problems with fever, chills, shortness of breath, cough, dysuria, frequency, nausea, vomiting, abdominal pain, anxiety, or depression. She has moderately severe short-term memory deficits.  She was followed closely by staff from the physical therapy and  occupational therapy departments.  She was unable to sit up and stand because of her severe low back pain and weakness.  She was then seen by a physician from interventional radiology who recommended a vertebral fracture augmentation procedure after further delineation of the fracture with lumbar spine MRI exam.  The lumbar spine MRI scan was done without IV contrast and showed a mild L2 level compression fracture with resultant 20% loss of vertebral body height and mild marrow edema with no significant associated spinal stenosis.  The fracture was felt to be acute.  There was also moderate degenerative disc disease seen, particularly at the L4/5 level and a left-sided renal cyst.  On March 9 she went on to have a L2 kyphoplasty procedure with Dr. Bonnielee Haff, with no immediate complications and significant reduction and level of pain.  Her anticoagulation had been switched to heparin which was held before and after the procedure.  On the morning of discharge she felt fine and was sleeping, having had no significant problems since her kyphoplasty procedure.  She is not having problems with dyspnea, cough, chest pain, abdominal pain, or constipation.  There were no complications from her procedures which included a CT scan of the lumbar spine, CT scan of the head without IV contrast, CT scan of the cervical spine, and MRI exam of the lumbar spine without IV contrast.   Consults: Interventional Radiology (Dr. Bonnielee Haff)  Significant Diagnostic Studies:  Ir Kypho Vertebral Lumbar Augmentation  01/31/2014   CLINICAL DATA:  L2 compression fracture.  Intractable pain.  EXAM: L2 KYPHOPLASTY  MEDICATIONS AND MEDICAL HISTORY: Versed  1.5 mg, Fentanyl 100 mcg.  As antibiotic prophylaxis, Ancef was ordered pre-procedure and administered intravenously within one hour of incision.  ANESTHESIA/SEDATION: Moderate sedation time: 21 minutes  CONTRAST:  None  FLUOROSCOPY TIME:  7 min and 42 seconds.  PROCEDURE: The procedure, risks,  benefits, and alternatives were explained to the patient. Questions regarding the procedure were encouraged and answered. The patient understands and consents to the procedure.  The back was prepped with Betadine in a sterile fashion, and a sterile drape was applied covering the operative field. A sterile gown and sterile gloves were used for the procedure.  Under fluoroscopic guidance, a 10 gauge needle was inserted into the L2 vertebral body via a right transpedicular approach. A bone drill was inserted into the after cannula and into the anterior third of the vertebral body, crossing the midline. It was exchanged for the 1.5 cm balloon. It was insufflated to 200 lb per sq range. It was exchanged for the cement cannula. Cement was injected crossing the midline and extending from the inferior to superior endplate.  No extra osseous cement was noted. After cement hardening, the patient was transferred to her bed without complication.  FINDINGS: Images demonstrate needle placement and balloon placement in the L2 vertebral body via a right transpedicular approach. The final image demonstrates cement filling the vertebral body, crossing the midline, and extending from the inferior to superior end plate. No extra osseous cement.  IMPRESSION: Successful L2 kyphoplasty.   Electronically Signed   By: Maryclare Bean M.D.   On: 01/31/2014 11:53    Labs: Lab Results  Component Value Date   WBC 6.9 02/01/2014   HGB 11.5* 02/01/2014   HCT 34.4* 02/01/2014   MCV 83.1 02/01/2014   PLT 236 02/01/2014     Recent Labs Lab 01/28/14 0405  NA 139  K 3.7  CL 101  CO2 25  BUN 18  CREATININE 0.92  CALCIUM 9.1  PROT 5.6*  BILITOT 0.6  ALKPHOS 76  ALT 10  AST 13  GLUCOSE 152*       Lab Results  Component Value Date   INR 1.12 10/01/2013     Recent Results (from the past 240 hour(s))  SURGICAL PCR SCREEN     Status: None   Collection Time    01/31/14  5:08 AM      Result Value Ref Range Status   MRSA, PCR  NEGATIVE  NEGATIVE Final   Staphylococcus aureus NEGATIVE  NEGATIVE Final   Comment:            The Xpert SA Assay (FDA     approved for NASAL specimens     in patients over 21 years of age),     is one component of     a comprehensive surveillance     program.  Test performance has     been validated by [employer] for patients greater     than or equal to 30 year old.     It is not intended     to diagnose infection nor to     guide or monitor treatment.      Discharge Exam: Blood pressure 159/103, pulse 60, temperature 97.5 F (36.4 C), temperature source Oral, resp. rate 18, weight 69.854 kg (154 lb), SpO2 94.00%.  Physical Exam: In general, the patient is a frail elderly white woman who was asleep while lying at 10 elevation of head of bed.  HEENT exam was within normal  limits, neck was without jugular venous distention, chest was clear to auscultation, heart had a regular rate and rhythm without significant murmur or gallop, abdomen had normal bowel sounds and no tenderness, extremities were without edema.  Disposition:  she will be discharged from the hospital to go to a skilled nursing facility for rehabilitation.  Given that she had bacteriuria with urine cultures growing out negative her antibiotic was discontinued at the time of discharge. Upon discharge from the skilled nursing facility a follow-up visit could be scheduled with her primary care physician, Jarome Matinaniel Robinette Esters, by calling telephone number [phone].  Discharge Orders   Future Orders Complete By Expires   Call MD for:  As directed    Comments:     Call physician for fever, chills, difficulty breathing worsened back pain or leg weakness, or other concerning symptoms.   Diet - low sodium heart healthy  As directed    Discharge instructions  As directed    Comments:     She will go to a skilled nursing facility to improve her gait and strength after acute L2 compression fracture. She should have nutrition  supplements such as medpass 6 ounces three times daily.   Increase activity slowly  As directed        Medication List         acetaminophen 325 MG tablet  Commonly known as:  TYLENOL  Take 2 tablets (650 mg total) by mouth every 6 (six) hours as needed for mild pain (or Fever >/= 101).     amLODipine 2.5 MG tablet  Commonly known as:  NORVASC  Take 1 tablet (2.5 mg total) by mouth daily.     calcium carbonate 600 MG Tabs tablet  Commonly known as:  OS-CAL  Take 600 mg by mouth daily with breakfast.     citalopram 10 MG tablet  Commonly known as:  CELEXA  Take 10 mg by mouth at bedtime.     donepezil 10 MG tablet  Commonly known as:  ARICEPT  Take 10 mg by mouth daily.     losartan-hydrochlorothiazide 100-12.5 MG per tablet  Commonly known as:  HYZAAR  Take 1 tablet by mouth daily.     metFORMIN 500 MG tablet  Commonly known as:  GLUCOPHAGE  Take 500 mg by mouth 2 (two) times daily with a meal.     oxybutynin 5 MG 24 hr tablet  Commonly known as:  DITROPAN-XL  Take 10 mg by mouth daily.     polyethylene glycol packet  Commonly known as:  MIRALAX / GLYCOLAX  Take 17 g by mouth daily.     PRADAXA 150 MG Caps capsule  Generic drug:  dabigatran  Take 150 mg by mouth daily.     senna 8.6 MG Tabs tablet  Commonly known as:  SENOKOT  Take 1 tablet (8.6 mg total) by mouth 2 (two) times daily.     SYNTHROID 25 MCG tablet  Generic drug:  levothyroxine  Take 12.5 mcg by mouth daily.         Signed: Garlan FillersPATERSON,Emelda Kohlbeck G 02/01/2014, 10:08 AM

## 2014-02-08 ENCOUNTER — Non-Acute Institutional Stay (SKILLED_NURSING_FACILITY): Payer: Medicare PPO | Admitting: Internal Medicine

## 2014-02-08 DIAGNOSIS — S32020K Wedge compression fracture of second lumbar vertebra, subsequent encounter for fracture with nonunion: Secondary | ICD-10-CM

## 2014-02-08 DIAGNOSIS — IMO0002 Reserved for concepts with insufficient information to code with codable children: Secondary | ICD-10-CM

## 2014-02-08 DIAGNOSIS — F05 Delirium due to known physiological condition: Principal | ICD-10-CM

## 2014-02-08 DIAGNOSIS — R410 Disorientation, unspecified: Secondary | ICD-10-CM

## 2014-02-08 DIAGNOSIS — M48061 Spinal stenosis, lumbar region without neurogenic claudication: Secondary | ICD-10-CM

## 2014-02-14 NOTE — Progress Notes (Addendum)
Patient ID: Dawn Bradshaw, female   DOB: [birth year], 80 y.o.   MRN: [id number]                  HISTORY & PHYSICAL  DATE:  02/08/2014    [facility]    LEVEL OF CARE:   SNF   CHIEF COMPLAINT:  Admission to SNF[facility], 01/27/2014 through 02/01/2014.    HISTORY OF PRESENT ILLNESS:  This is an 80 year-old woman with multiple medical issues, who fell recently leading to a right hip fracture with open reduction and internal fixation.  She returned home last month and was doing well.  She had 24-hour care at home provided by family.  She got up in the early hours of the morning on the day of admission and fell on her back with resultant moderate back pain.  X-ray of the lumbar spine showed an L2 compression fracture.   CT scan of the back showed an acute L2 compression fracture.  She could not ambulate.  Therefore, she was admitted.    She was also noted to have moderately severe dementia.    She underwent an L2 kyphoplasty on March 9th.  She also was noted to have significant spinal stenosis in the lumbar area.    LABORATORY DATA:   Her lab work on 01/28/2014 showed an essentially normal comprehensive metabolic panel except for an albumin of 2.7.    Her GFR was calculated at 56, but BUN and creatinine were within the normal range.                       PAST MEDICAL HISTORY/PROBLEM LIST:  Includes:  Acute low back pain secondary to an acute L2 compression fracture.    Lumbar spinal stenosis.    Gait instability.   Apparently used a walker.    Protein calorie malnutrition.     Recent right hip fracture.    Atrial fibrillation.     Dementia.    Anemia.    CURRENT MEDICATIONS:  Discharge medications include:      Norvasc 2.5 q.d.    Celexa 10 q.d.    Donepezil 10 q.d.    Hyzaar 100/12.5, 1 tablet daily.    Metformin 500 b.i.d.    Oxybutynin/Ditropan-XL 10 mg daily.    MiraLAX 17 g daily.    Pradaxa 150 daily.    Senna 8.6 b.i.d.     Synthroid 12.5 daily.    SOCIAL HISTORY:   FUNCTIONAL STATUS:  The patient  apparently has 24-hour per day care at home.  I really have no additional information.   CODE STATUS:  She comes with no advanced directives.    REVIEW OF SYSTEMS:  Not possible.     PHYSICAL EXAMINATION:   VITAL SIGNS:   PULSE:  86 and irregular.   RESPIRATIONS:  18.   GENERAL APPEARANCE:  The patient is somewhat lethargic.  Speech is thick.   CHEST/RESPIRATORY:  Bibasilar crackles are noted.   CARDIOVASCULAR:  CARDIAC:   Heart sounds are irregular.  There are no murmurs.  If anything, this lady is volume-contracted.   GASTROINTESTINAL:  ABDOMEN:   Distended.  Bowel sounds are positive.  No masses.   LIVER/SPLEEN/KIDNEYS:  No liver, no spleen.   GENITOURINARY:  BLADDER:   There is suprapubic tenderness, but no clear costovertebral angle tenderness.   CIRCULATION:  EDEMA/VARICOSITIES:  Extremities:  No edema is noted.  She has varicose veins.   ARTERIAL:  Peripheral pulses are palpable.   NEUROLOGICAL:    SENSATION/STRENGTH:  She does not have antigravity strength in her proximal legs.  She can move both her toes.  She can lift both her arms seemingly with equal strength.   DEEP TENDON REFLEXES:  Reflexes are present in the knees.  Both toes are downgoing.   PSYCHIATRIC:   MENTAL STATUS:   Once again, she is able to state her date of birth, her year of birth was [birth year].  Could not name the current year or month.  Did not seem to have any awareness of where she was.    ASSESSMENT/PLAN:    Delirium on the background of dementia.  Listed as moderate by her discharge physicians.  I am concerned about the possibility of dehydration clinically, as well as a UTI.  I will check both of these and perhaps put her on empiric antibiotics for the UTI possibility.    Background dementia.  Listed as moderate.    Moderate protein calorie malnutrition.  Albumin was 2.7.    L2 compression fracture.  Status post  kyphoplasty.    Lumbar spinal stenosis.    Osteoperosis: she will need a screen at some point  Atrial fibrillation; rate is controlled on Pradaxa  CPT CODE: 16109

## 2014-03-01 ENCOUNTER — Non-Acute Institutional Stay (SKILLED_NURSING_FACILITY): Payer: Commercial Managed Care - HMO | Admitting: Internal Medicine

## 2014-03-01 DIAGNOSIS — S32020K Wedge compression fracture of second lumbar vertebra, subsequent encounter for fracture with nonunion: Secondary | ICD-10-CM

## 2014-03-01 DIAGNOSIS — F05 Delirium due to known physiological condition: Secondary | ICD-10-CM

## 2014-03-01 DIAGNOSIS — R410 Disorientation, unspecified: Secondary | ICD-10-CM

## 2014-03-01 DIAGNOSIS — IMO0002 Reserved for concepts with insufficient information to code with codable children: Secondary | ICD-10-CM

## 2014-03-03 NOTE — Progress Notes (Addendum)
Patient ID: Dawn Bradshaw, female   DOB: 1927/04/12, 80 y.o.   MRN: [id number]                   PROGRESS NOTE  DATE:  03/01/2014     [facility]    LEVEL OF CARE:   SNF   Acute Visit   CHIEF COMPLAINT:  Follow up medical issues.    HISTORY OF PRESENT ILLNESS:  [name] is a frail, 80 year-old woman with multiple medical issues, who came to us after a stay at [facility] from 01/27/2014 through 02/01/2014.  She had had an L2 compression fracture documented on CT scan after a fall.  She had a kyphoplasty on March 9th.  She was felt to have delirium on top of moderate depression.    Staff report that she is doubly incontinent.  She requires maximal assistance for transferring.   She spends short periods of time sitting up in her wheelchair.   However, she insists on going back to bed.    She has a small pressure ulcer just near her coccyx area, although this appears to be getting better and really has only measured in the millimeters in all directions.    CURRENT MEDICATIONS:  Medication list is reviewed.     She is on metformin.  Presumably a diabetic, although I did not list this on my initial review.  She has not had a hemoglobin A1c.  She did have one on 01/28/2014 at [facility] at 6.  This may be a little overly aggressive for a frail, elderly woman.    She is also on Ditropan.  Presumably for urinary frequency, although she is doubly incontinent according to the information I was able to get.    She is on Celexa for depression.      PHYSICAL EXAMINATION:   GENERAL APPEARANCE:  This is a frail woman who requires assistance even with rolling in bed.   CHEST/RESPIRATORY:  Clear air entry bilaterally.   CARDIOVASCULAR:  CARDIAC:   Heart sounds are normal.  She appears to be euvolemic.   GASTROINTESTINAL:  ABDOMEN:   No masses.   LIVER/SPLEEN/KIDNEYS:  No liver, no spleen.   GENITOURINARY:  BLADDER:   She does have some suprapubic tenderness.  However, she  does not appear to be grossly distended.   PSYCHIATRIC:   MENTAL STATUS:   Any degree of delirium she had has resolved.  I think there is moderate background dementia and she is on Aricept.    ASSESSMENT/PLAN:    L2 compression fracture secondary to osteoporosis and a fall.  She still complains of back pain.  She also has lumbar spinal stenosis.  Tends to confine herself to bed, although I am not completely convinced this is all due to pain.    Type 2 diabetes.  Her hemoglobin A1c was 6.  I will see if any surveillance CBGs have been done.  However, the metformin may be able to be stopped.    Urinary frequency.  On oxybutynin.  She is doubly incontinent.  I cannot imagine that the oxybutynin is helpful in this situation.    Depression.   On Celexa.  I think this is probably stable.      ADDENDUM:  I note that I had put the Celexa and Ditropan on hold on 02/08/2014, I think out of concerns about her mental status.  I think these both can be discontinued.    I will have them do a  CBG log, shown to me next week.  If, as expected, these are within the normal range given her hemoglobin A1c of 6, I will discontinue this as well.

## 2014-03-29 ENCOUNTER — Non-Acute Institutional Stay (SKILLED_NURSING_FACILITY): Payer: Medicare PPO | Admitting: Internal Medicine

## 2014-03-29 DIAGNOSIS — IMO0002 Reserved for concepts with insufficient information to code with codable children: Secondary | ICD-10-CM

## 2014-03-29 DIAGNOSIS — S32020K Wedge compression fracture of second lumbar vertebra, subsequent encounter for fracture with nonunion: Secondary | ICD-10-CM

## 2014-03-29 DIAGNOSIS — F0280 Dementia in other diseases classified elsewhere without behavioral disturbance: Secondary | ICD-10-CM

## 2014-03-29 DIAGNOSIS — I4891 Unspecified atrial fibrillation: Secondary | ICD-10-CM

## 2014-03-29 DIAGNOSIS — G309 Alzheimer's disease, unspecified: Principal | ICD-10-CM

## 2014-04-04 NOTE — Progress Notes (Addendum)
Patient ID: Dawn Bradshaw, female   DOB: [birth year], 80 y.o.   MRN: [id number]                  PROGRESS NOTE  DATE:  03/29/2014    FAC[facility]    LEVEL OF CARE:   SNF   Acute Visit   CHIEF COMPLAINT:  Review of general medical problems, including diabetes.    HISTORY OF PRESENT ILLNESS:  This is a patient who comes to us after suffering an L2 compression fracture.  This was in March of this year.  She was hospitalized.  She underwent a kyphoplasty.    She also was delirious in the hospital.  I have felt that she probably had moderate background dementia and is on Aricept.    Other medical issues include type 2 diabetes.  Her hemoglobin A1c on 03/16/2014 was at 6.1.  She is on metformin 1000 a day.  I think in a frail, elderly patient at 80 years old, this is probably a superfluous level of control and I am going to discontinue the metformin and see how she does over time.  Her blood sugars seem to range in the low to mid 100s and we are measuring this three times a week.    She did have some elements of depression, although that seems to have resolved.  I discontinued her Celexa and Ditropan in March out of concerns about her delirium.   She is doubly incontinent and mostly bedbound.    She went on to have a FEES study which suggested she is satisfactory to go to mechanical soft with thin liquids.  I am not exactly sure what she is on currently.      PHYSICAL EXAMINATION:   GENERAL APPEARANCE:  Once again, the patient is in bed.   CHEST/RESPIRATORY:  Clear air entry bilaterally.   CARDIOVASCULAR:  CARDIAC:   Heart sounds are normal.  She appears to be euvolemic.   GASTROINTESTINAL:  ABDOMEN:   Soft.   LIVER/SPLEEN/KIDNEYS:  No liver, no spleen.  No tenderness.   CIRCULATION:   EDEMA/VARICOSITIES:  Extremities:  No evidence of a DVT.    ASSESSMENT/PLAN:  Moderately advanced dementia.  On Aricept.  This is stable.  There is no evidence of delirium or depression  based on today's review.    Type 2 diabetes.  Recent hemoglobin A1c is 6.1.  I think we can try to taper her metformin and I am going to start this today.    L2 compression fracture.  Status post kyphoplasty.  She is not complaining of any pain, although she is largely in bed almost every time I see her.    Hypothyroidism.  On a small dose of Synthroid at 12.5 mcg a day.  Recent TSH in the therapeutic range on 03/16/2014.      Atrial fibrillation.  On Pradaxa.  Her pulse rate today was in the 70s.    Gait instability.  Prior to the admission with the L2 compression fracture in March, she  apparently used a walker.  I do not think she has graduated to anything close to this.

## 2014-04-05 ENCOUNTER — Encounter: Payer: Self-pay | Admitting: Internal Medicine

## 2014-04-05 NOTE — Progress Notes (Signed)
This encounter was created in error - please disregard.  This encounter was created in error - please disregard.

## 2014-05-16 ENCOUNTER — Non-Acute Institutional Stay (SKILLED_NURSING_FACILITY): Payer: Commercial Managed Care - HMO | Admitting: Nurse Practitioner

## 2014-05-16 DIAGNOSIS — D649 Anemia, unspecified: Secondary | ICD-10-CM

## 2014-05-16 DIAGNOSIS — E119 Type 2 diabetes mellitus without complications: Secondary | ICD-10-CM

## 2014-05-16 DIAGNOSIS — I4891 Unspecified atrial fibrillation: Secondary | ICD-10-CM

## 2014-05-16 DIAGNOSIS — I482 Chronic atrial fibrillation, unspecified: Secondary | ICD-10-CM

## 2014-05-16 DIAGNOSIS — F0390 Unspecified dementia without behavioral disturbance: Secondary | ICD-10-CM

## 2014-05-16 DIAGNOSIS — IMO0002 Reserved for concepts with insufficient information to code with codable children: Secondary | ICD-10-CM

## 2014-05-16 DIAGNOSIS — R269 Unspecified abnormalities of gait and mobility: Secondary | ICD-10-CM

## 2014-05-16 DIAGNOSIS — S32020D Wedge compression fracture of second lumbar vertebra, subsequent encounter for fracture with routine healing: Secondary | ICD-10-CM

## 2014-05-16 DIAGNOSIS — E46 Unspecified protein-calorie malnutrition: Secondary | ICD-10-CM

## 2014-05-16 DIAGNOSIS — R2681 Unsteadiness on feet: Secondary | ICD-10-CM

## 2014-05-16 DIAGNOSIS — E038 Other specified hypothyroidism: Secondary | ICD-10-CM

## 2014-05-16 DIAGNOSIS — E1365 Other specified diabetes mellitus with hyperglycemia: Secondary | ICD-10-CM

## 2014-05-16 NOTE — Progress Notes (Signed)
Patient ID: Dawn Bradshaw, female   DOB: 02-21-1927, 80 y.o.   MRN: [id number]    Nursing Home Location:  [facility]   Place of Service: SNF (31)  PCP: Garlan FillersPATERSON,DANIEL G, MD  Allergies  Allergen Reactions  . Codeine Hives, Itching and Rash    Chief Complaint  Patient presents with  . Medical Management of Chronic Issues    HPI:  Pt is an 80 year-old woman with pmh of hyperlipidemia, afib, hypothyroidism, anemia, right hip fracture s/p ORIF, dementia who feel at home and found to have L2 compression fracture. Pt was hospitalzied and underwent an L2 kyphoplasty on March 9th because she was unable to ambulate. She also was noted to have significant spinal stenosis in the lumbar area. Pt is now at [facility]. Due to worsening delirium and AMS pt was taken off her SSRI however since then staff reports she is not wanting to participate in activities or therapy and has been withdrawn. Pt is a poor historian therefore not able to give a reliable ROS or HPI. Staff also questions increased pain.   Review of Systems:  Pt denies all symptoms.   Past Medical History  Diagnosis Date  . Hyperlipidemia   . Thyroid disease     hypothyroidism  . Psoriasis   . Osteopenia   . LBP (low back pain)     mild  . Palpitations     H/O  . Diabetes mellitus     Type II  . Alzheimer's disease     mild  . Arrhythmia 4/12    A-Fib @ [facility]  . Atrial fibrillation   . Stroke   . Hypertension    Past Surgical History  Procedure Laterality Date  . Cleft palate repair    . Myomectomy  1980  . Colonoscopy  1999  . Orif hip fracture  08/2002  . Cataract extraction  08/2008  . Femur im nail Right 10/01/2013    Procedure: INTRAMEDULLARY (IM) NAIL FEMORAL;  Surgeon: Nestor LewandowskyFrank J Rowan, MD;  Location: [facility]S;  Service: Orthopedics;  Laterality: Right;   Social History:   reports that she quit smoking about 65 years ago. Her smoking use included Cigarettes. She smoked 0.00 packs per  day. She has never used smokeless tobacco. She reports that she does not drink alcohol or use illicit drugs.  Family History  Problem Relation Age of Onset  . Heart disease Brother     Medications: Patient's Medications  New Prescriptions   No medications on file  Previous Medications   ACETAMINOPHEN (TYLENOL) 325 MG TABLET    Take 2 tablets (650 mg total) by mouth every 6 (six) hours as needed for mild pain (or Fever >/= 101).   AMLODIPINE (NORVASC) 2.5 MG TABLET    Take 1 tablet (2.5 mg total) by mouth daily.   CALCIUM CARBONATE (OS-CAL) 600 MG TABS TABLET    Take 600 mg by mouth daily with breakfast.   DABIGATRAN (PRADAXA) 150 MG CAPS    Take 150 mg by mouth 2 (two) times daily.    DONEPEZIL (ARICEPT) 10 MG TABLET    Take 10 mg by mouth daily.    LEVOTHYROXINE (SYNTHROID) 25 MCG TABLET    Take 12.5 mcg by mouth daily.    LOSARTAN-HYDROCHLOROTHIAZIDE (HYZAAR) 100-12.5 MG PER TABLET    Take 1 tablet by mouth daily.    METFORMIN (GLUCOPHAGE) 500 MG TABLET    Take 500 mg by mouth daily with breakfast.  POLYETHYLENE GLYCOL (MIRALAX / GLYCOLAX) PACKET    Take 17 g by mouth daily.   SENNA (SENOKOT) 8.6 MG TABS TABLET    Take 1 tablet (8.6 mg total) by mouth 2 (two) times daily.  Modified Medications   No medications on file  Discontinued Medications   CITALOPRAM (CELEXA) 10 MG TABLET    Take 10 mg by mouth at bedtime.   OXYBUTYNIN (DITROPAN-XL) 5 MG 24 HR TABLET    Take 10 mg by mouth daily.     Physical Exam: Physical Exam  Constitutional: She is well-developed, well-nourished, and in no distress.  HENT:  Head: Normocephalic and atraumatic.  Mouth/Throat: Oropharynx is clear and moist. No oropharyngeal exudate.  Neck: Normal range of motion. Neck supple.  Cardiovascular: Normal rate and normal heart sounds.   Pulmonary/Chest: Effort normal and breath sounds normal.  Abdominal: Soft. Bowel sounds are normal.  Musculoskeletal: She exhibits no edema and no tenderness.    Neurological: She is alert.  Skin: Skin is warm and dry.  Psychiatric: Affect normal.    Filed Vitals:   05/16/14 1215  BP: 118/82  Pulse: 88  Temp: 98.3 F (36.8 C)  Resp: 20  Weight: 145 lb (65.772 kg)      Labs reviewed: Basic Metabolic Panel:  Recent Labs  16/10/96 0340 10/03/13 0412 01/27/14 0918 01/27/14 2002 01/28/14 0405  NA 133* 131* 139  --  139  K 3.9 3.6 3.5*  --  3.7  CL 97 96 101  --  101  CO2 25 28  --   --  25  GLUCOSE 195* 175* 139*  --  152*  BUN 18 13 18   --  18  CREATININE 0.89 0.84 0.90 0.79 0.92  CALCIUM 9.0 8.7  --   --  9.1   Liver Function Tests:  Recent Labs  10/01/13 1159 01/28/14 0405  AST 20 13  ALT 15 10  ALKPHOS 65 76  BILITOT 0.4 0.6  PROT 6.1 5.6*  ALBUMIN 3.1* 2.7*   No results found for this basename: LIPASE, AMYLASE,  in the last 8760 hours No results found for this basename: AMMONIA,  in the last 8760 hours CBC:  Recent Labs  10/01/13 1159  01/27/14 0912  01/30/14 0054 01/31/14 0410 02/01/14 0458  WBC 8.5  < > 7.4  < > 7.6 7.5 6.9  NEUTROABS 6.9  --  5.9  --   --   --   --   HGB 13.4  < > 13.1  < > 11.1* 11.5* 11.5*  HCT 39.5  < > 39.6  < > 32.7* 34.5* 34.4*  MCV 87.8  < > 84.1  < > 83.4 83.3 83.1  PLT 224  < > 219  < > 190 245 236  < > = values in this interval not displayed. Cardiac Enzymes:  Recent Labs  10/01/13 1159  TROPONINI <0.30   BNP: No components found with this basename: POCBNP,  CBG:  Recent Labs  01/30/14 0801 01/31/14 0755 02/01/14 0736  GLUCAP 132* 123* 114*   TSH:  Recent Labs  10/01/13 1159  TSH 1.552   A1C: Lab Results  Component Value Date   HGBA1C 6.0* 01/28/2014   Basic Metabolic Panel    Result: 03/16/2014 12:17 PM   ( Status: F )     C Sodium 140     135-145 mEq/L SLN   Potassium 3.6     3.5-5.3 mEq/L SLN   Chloride 103     96-112  mEq/L SLN   CO2 28     19-32 mEq/L SLN   Glucose 162   H 70-99 mg/dL SLN   BUN 13     1-616-23 mg/dL SLN   Creatinine 0.960.75      0.50-1.10 mg/dL SLN   Calcium 9.6     0.4-54.08.4-10.5 mg/dL SLN   TSH, Ultrasensitive    Result: 03/16/2014 12:10 PM   ( Status: F )       TSH 1.249     0.350-4.500 uIU/mL SLN   Hemoglobin A1C    Result: 03/16/2014 2:45 PM   ( Status: F )       Hemoglobin A1C 6.1   H <5.7 % SLN C Estimated Average Glucose 128   H <117 mg/dL SLN  Assessment/Plan 1. Chronic atrial fibrillation -stable, conts on pradaxa   2. Other specified hypothyroidism Will follow up TSH  -conts synthroid 25 mcg  3. Dementia, without behavioral disturbance conts on aricept   4. Compression fracture of L2 lumbar vertebra, with routine healing, subsequent encounter -denies pain today but staff reports she is not motivated in therapy, question due to pain tylenol added  5. Anemia, unspecified anemia type -will follow up cbc  6. Gait instability conts to work with therapies, possible lack of motivation due to pain, tylenol added to help with pain   7. Unspecified protein-calorie malnutrition -weight has been stable, followed by RD, will follow up CMP next month  8. Diabetes  - metformin 500 mg daily, blood sugars reviewed and have been stable.

## 2014-06-13 ENCOUNTER — Non-Acute Institutional Stay (SKILLED_NURSING_FACILITY): Payer: Commercial Managed Care - HMO | Admitting: Nurse Practitioner

## 2014-06-13 ENCOUNTER — Encounter: Payer: Self-pay | Admitting: Nurse Practitioner

## 2014-06-13 DIAGNOSIS — R2681 Unsteadiness on feet: Secondary | ICD-10-CM

## 2014-06-13 DIAGNOSIS — R269 Unspecified abnormalities of gait and mobility: Secondary | ICD-10-CM

## 2014-06-13 DIAGNOSIS — F0390 Unspecified dementia without behavioral disturbance: Secondary | ICD-10-CM

## 2014-06-13 DIAGNOSIS — F329 Major depressive disorder, single episode, unspecified: Secondary | ICD-10-CM

## 2014-06-13 DIAGNOSIS — D649 Anemia, unspecified: Secondary | ICD-10-CM

## 2014-06-13 DIAGNOSIS — F32A Depression, unspecified: Secondary | ICD-10-CM | POA: Insufficient documentation

## 2014-06-13 DIAGNOSIS — F3289 Other specified depressive episodes: Secondary | ICD-10-CM

## 2014-06-13 DIAGNOSIS — IMO0002 Reserved for concepts with insufficient information to code with codable children: Secondary | ICD-10-CM

## 2014-06-13 DIAGNOSIS — S32020D Wedge compression fracture of second lumbar vertebra, subsequent encounter for fracture with routine healing: Secondary | ICD-10-CM

## 2014-06-13 NOTE — Progress Notes (Signed)
Patient ID: Dawn Bradshaw, female   DOB: [birth year], 80 y.o.   MRN: [id number]    Nursing Home Location:  [facility]   Place of Service: SNF (31)  PCP: Garlan Fillers, MD  Allergies  Allergen Reactions  . Codeine Hives, Itching and Rash    Chief Complaint  Patient presents with  . Medical Management of Chronic Issues    HPI:  Pt is an 80 year-old woman with pmh of hyperlipidemia, afib, hypothyroidism, anemia, right hip fracture s/p ORIF, dementia who feel at home and found to have L2 compression fracture and underwent L2 kyphoplasty. Pt working with PT/OT and doing well jsut needing lots of motivation. Likes to stay in the bed most of the time. Does not complain of pain and has a better affect about her during this visit from previous time seen. Pt does not complain of pain. Staff without concerns at this time.    Review of Systems:  Review of Systems  Constitutional: Negative for weight loss and malaise/fatigue.  Respiratory: Negative for cough and shortness of breath.   Cardiovascular: Negative for chest pain and leg swelling.  Gastrointestinal: Negative for heartburn, abdominal pain, diarrhea and constipation.  Genitourinary: Negative for dysuria.  Skin: Negative for itching and rash.  Neurological: Negative for dizziness, tingling, weakness and headaches.  Psychiatric/Behavioral: Positive for memory loss. Negative for depression. The patient is not nervous/anxious.      Past Medical History  Diagnosis Date  . Hyperlipidemia   . Thyroid disease     hypothyroidism  . Psoriasis   . Osteopenia   . LBP (low back pain)     mild  . Palpitations     H/O  . Diabetes mellitus     Type II  . Alzheimer's disease     mild  . Arrhythmia 4/12    A-Fib @ [facility]  . Atrial fibrillation   . Stroke   . Hypertension    Past Surgical History  Procedure Laterality Date  . Cleft palate repair    . Myomectomy  1980  . Colonoscopy  1999  . Orif hip fracture   08/2002  . Cataract extraction  08/2008  . Femur im nail Right 10/01/2013    Procedure: INTRAMEDULLARY (IM) NAIL FEMORAL;  Surgeon: Nestor Lewandowsky, MD;  Location: [facility];  Service: Orthopedics;  Laterality: Right;   Social History:   reports that she quit smoking about 65 years ago. Her smoking use included Cigarettes. She smoked 0.00 packs per day. She has never used smokeless tobacco. She reports that she does not drink alcohol or use illicit drugs.  Family History  Problem Relation Age of Onset  . Heart disease Brother     Medications: Patient's Medications  New Prescriptions   No medications on file  Previous Medications   ACETAMINOPHEN (TYLENOL) 325 MG TABLET    Take 2 tablets (650 mg total) by mouth every 6 (six) hours as needed for mild pain (or Fever >/= 101).   AMLODIPINE (NORVASC) 2.5 MG TABLET    Take 1 tablet (2.5 mg total) by mouth daily.   CALCIUM CARBONATE (OS-CAL) 600 MG TABS TABLET    Take 600 mg by mouth daily with breakfast.   DABIGATRAN (PRADAXA) 150 MG CAPS    Take 150 mg by mouth 2 (two) times daily.    DONEPEZIL (ARICEPT) 10 MG TABLET    Take 10 mg by mouth daily.    LEVOTHYROXINE (SYNTHROID) 25 MCG TABLET    Take  12.5 mcg by mouth daily.    LOSARTAN-HYDROCHLOROTHIAZIDE (HYZAAR) 100-12.5 MG PER TABLET    Take 1 tablet by mouth daily.    METFORMIN (GLUCOPHAGE) 500 MG TABLET    Take 500 mg by mouth daily with breakfast.    POLYETHYLENE GLYCOL (MIRALAX / GLYCOLAX) PACKET    Take 17 g by mouth daily.   SENNA (SENOKOT) 8.6 MG TABS TABLET    Take 1 tablet (8.6 mg total) by mouth 2 (two) times daily.   SERTRALINE (ZOLOFT) 50 MG TABLET    Take 50 mg by mouth daily.  Modified Medications   No medications on file  Discontinued Medications   No medications on file     Physical Exam:  Filed Vitals:   06/13/14 1604  BP: 104/72  Pulse: 72  Temp: 97 F (36.1 C)  Resp: 20  Weight: 149 lb (67.586 kg)   Physical Exam  Constitutional: She is well-developed,  well-nourished, and in no distress.  HENT:  Head: Normocephalic and atraumatic.  Nose: Nose normal.  Mouth/Throat: Oropharynx is clear and moist. No oropharyngeal exudate.  Eyes: Conjunctivae and EOM are normal. Pupils are equal, round, and reactive to light.  Neck: Normal range of motion. Neck supple.  Cardiovascular: Normal rate and normal heart sounds.   Pulmonary/Chest: Effort normal and breath sounds normal.  Abdominal: Soft. Bowel sounds are normal. She exhibits no distension.  Musculoskeletal: She exhibits no edema and no tenderness.  Neurological: She is alert.  Skin: Skin is warm and dry.  Psychiatric: Affect normal.      Labs reviewed: Basic Metabolic Panel:  Recent Labs  16/10/96 0340 10/03/13 0412 01/27/14 0918 01/27/14 2002 01/28/14 0405  NA 133* 131* 139  --  139  K 3.9 3.6 3.5*  --  3.7  CL 97 96 101  --  101  CO2 25 28  --   --  25  GLUCOSE 195* 175* 139*  --  152*  BUN 18 13 18   --  18  CREATININE 0.89 0.84 0.90 0.79 0.92  CALCIUM 9.0 8.7  --   --  9.1   Liver Function Tests:  Recent Labs  10/01/13 1159 01/28/14 0405  AST 20 13  ALT 15 10  ALKPHOS 65 76  BILITOT 0.4 0.6  PROT 6.1 5.6*  ALBUMIN 3.1* 2.7*   No results found for this basename: LIPASE, AMYLASE,  in the last 8760 hours No results found for this basename: AMMONIA,  in the last 8760 hours CBC:  Recent Labs  10/01/13 1159  01/27/14 0912  01/30/14 0054 01/31/14 0410 02/01/14 0458  WBC 8.5  < > 7.4  < > 7.6 7.5 6.9  NEUTROABS 6.9  --  5.9  --   --   --   --   HGB 13.4  < > 13.1  < > 11.1* 11.5* 11.5*  HCT 39.5  < > 39.6  < > 32.7* 34.5* 34.4*  MCV 87.8  < > 84.1  < > 83.4 83.3 83.1  PLT 224  < > 219  < > 190 245 236  < > = values in this interval not displayed. Cardiac Enzymes:  Recent Labs  10/01/13 1159  TROPONINI <0.30   BNP: No components found with this basename: POCBNP,  CBG:  Recent Labs  01/30/14 0801 01/31/14 0755 02/01/14 0736  GLUCAP 132* 123* 114*     TSH:  Recent Labs  10/01/13 1159  TSH 1.552   A1C: Lab Results  Component Value Date  HGBA1C 6.0* 01/28/2014  BC NO Diff (Complete Blood Count)    Result: 06/13/2014 9:51 AM   ( Status: F )     C WBC 5.4     4.0-10.5 K/uL SLN   RBC 4.34     3.87-5.11 MIL/uL SLN   Hemoglobin 12.3     12.0-15.0 g/dL SLN   Hematocrit 69.636.0     36.0-46.0 % SLN   MCV 82.9     78.0-100.0 fL SLN   MCH 28.3     26.0-34.0 pg SLN   MCHC 34.2     30.0-36.0 g/dL SLN   RDW 29.513.9     28.4-13.211.5-15.5 % SLN   Platelet Count 246     150-400 K/uL SLN   Comprehensive Metabolic Panel    Result: 06/13/2014 1:17 PM   ( Status: F )       Sodium 141     135-145 mEq/L SLN   Potassium 3.6     3.5-5.3 mEq/L SLN   Chloride 106     96-112 mEq/L SLN   CO2 23     19-32 mEq/L SLN   Glucose 114   H 70-99 mg/dL SLN   BUN 24   H 4-406-23 mg/dL SLN   Creatinine 1.020.89     0.50-1.10 mg/dL SLN   Bilirubin, Total 0.5     0.2-1.2 mg/dL SLN   Alkaline Phosphatase 56     39-117 U/L SLN   AST/SGOT 13     0-37 U/L SLN   ALT/SGPT 8     0-35 U/L SLN   Total Protein 5.6   L 6.0-8.3 g/dL SLN   Albumin 3.4   L 7.2-5.33.5-5.2 g/dL SLN   Calcium 9.3     6.6-44.08.4-10.5 mg/dL SLN   TSH, Ultrasensitive    Result: 06/13/2014 10:35 AM   ( Status: F )       TSH 0.550     0.350-4.500 uIU/m Assessment/Plan 1. Dementia, without behavioral disturbance Stable at this time, pt without acute cognitive or functional decline in the last month  2. Depression Mood is improved on zoloft, no side effects noted   3. Compression fracture of L2 lumbar vertebra, with routine healing, subsequent encounter Doing well with therapy, does not complain of pain today  4. Gait instability -conts with PT/OT  5. Anemia, unspecified anemia type -Hgb 12.6 on recent lab which is stable, will cont to monitor

## 2014-06-30 ENCOUNTER — Encounter: Payer: Self-pay | Admitting: Internal Medicine

## 2014-06-30 ENCOUNTER — Non-Acute Institutional Stay (SKILLED_NURSING_FACILITY): Payer: Commercial Managed Care - HMO | Admitting: Internal Medicine

## 2014-06-30 DIAGNOSIS — E038 Other specified hypothyroidism: Secondary | ICD-10-CM

## 2014-06-30 DIAGNOSIS — IMO0002 Reserved for concepts with insufficient information to code with codable children: Secondary | ICD-10-CM

## 2014-06-30 DIAGNOSIS — S32020D Wedge compression fracture of second lumbar vertebra, subsequent encounter for fracture with routine healing: Secondary | ICD-10-CM

## 2014-06-30 DIAGNOSIS — I482 Chronic atrial fibrillation, unspecified: Secondary | ICD-10-CM

## 2014-06-30 DIAGNOSIS — F0390 Unspecified dementia without behavioral disturbance: Secondary | ICD-10-CM

## 2014-06-30 DIAGNOSIS — D649 Anemia, unspecified: Secondary | ICD-10-CM

## 2014-06-30 DIAGNOSIS — I4891 Unspecified atrial fibrillation: Secondary | ICD-10-CM

## 2014-06-30 NOTE — Progress Notes (Signed)
Patient ID: Dawn Bradshaw, female   DOB: 15-Aug-1927, 80 y.o.   MRN: [id number]   this is a discharge note.  Level of care skilled.  Facility [facility].  Chief complaint discharge note   HPI:  Pt is an 80 year-old woman with pmh of hyperlipidemia, afib, hypothyroidism, anemia, right hip fracture s/p ORIF, dementia who feel at home and found to have L2 compression fracture and underwent L2 kyphoplasty. Pt working with PT/OT--apparently he is using a walker with them and doing relatively well.  She will be going home with her son apparently she has a very supportive family she also will continue PT OT and ST-this is complicated somewhat with her history of dementia-she also will need nursing followup for multiple medical issues including atrial fibrillation and diabetes which appears to be well-controlled.  She is on Glucophage 500 mg once a day CBGs appear to largely be in the mid lower 100s.  Patient does have some chronic complaints of back pain according to staff this is not new it has not increased--she does receive Tylenol when necessary apparently this gives some relief this is complicated with a history of codeine allergy     Family medical social history reviewed from previous progress notes including 04/04/2014.  Medications have been reviewed per MAR.     Review of Systems:  Review of Systems  Constitutional: Negative for weight loss and malaise/fatigue.  Respiratory: Negative for cough and shortness of breath.  Cardiovascular: Negative for chest pain and leg swelling.  Gastrointestinal: Negative for heartburn, abdominal pain, diarrhea and constipation.  Genitourinary: Negative for dysuria.  Skin: Negative for itching and rash MS-does complain at times of lower back pain this is chronic according to nursing staff however she has participated with therapy.  Neurological: Negative for dizziness, tingling, weakness and headaches.  Psychiatric/Behavioral: Positive for memory  loss. Negative for depression. The patient is not nervous/anxious.  Past Medical History   Diagnosis  Date   .  Hyperlipidemia    .  Thyroid disease      hypothyroidism   .  Psoriasis    .  Osteopenia    .  LBP (low back pain)      mild   .  Palpitations      H/O   .  Diabetes mellitus      Type II   .  Alzheimer's disease      mild   .  Arrhythmia  4/12     A-Fib @ [facility]   .  Atrial fibrillation    .  Stroke    .  Hypertension     Past Surgical History   Procedure  Laterality  Date   .  Cleft palate repair     .  Myomectomy   1980   .  Colonoscopy   1999   .  Orif hip fracture   08/2002   .  Cataract extraction   08/2008   .  Femur im nail  Right  10/01/2013     Procedure: INTRAMEDULLARY (IM) NAIL FEMORAL; Surgeon: Nestor Lewandowsky, MD; Location: [facility]; Service: Orthopedics; Laterality: Right;   Social History:  reports that she quit smoking about 65 years ago. Her smoking use included Cigarettes. She smoked 0.00 packs per day. She has never used smokeless tobacco. She reports that she does not drink alcohol or use illicit drugs.  Family History   Problem  Relation  Age of Onset   .  Heart disease  Brother  Medications:  Patient's Medications   New Prescriptions    No medications on file   Previous Medications    ACETAMINOPHEN (TYLENOL) 325 MG TABLET  Take 2 tablets (650 mg total) by mouth every 6 (six) hours as needed for mild pain (or Fever >/= 101).    AMLODIPINE (NORVASC) 2.5 MG TABLET  Take 1 tablet (2.5 mg total) by mouth daily.    CALCIUM CARBONATE (OS-CAL) 600 MG TABS TABLET  Take 600 mg by mouth daily with breakfast.    DABIGATRAN (PRADAXA) 150 MG CAPS  Take 150 mg by mouth 2 (two) times daily.    DONEPEZIL (ARICEPT) 10 MG TABLET  Take 10 mg by mouth daily.    LEVOTHYROXINE (SYNTHROID) 25 MCG TABLET  Take 12.5 mcg by mouth daily.    LOSARTAN-HYDROCHLOROTHIAZIDE (HYZAAR) 100-12.5 MG PER TABLET  Take 1 tablet by mouth daily.    METFORMIN (GLUCOPHAGE) 500 MG  TABLET  Take 500 mg by mouth daily with breakfast.    POLYETHYLENE GLYCOL (MIRALAX / GLYCOLAX) PACKET  Take 17 g by mouth daily.    SENNA (SENOKOT) 8.6 MG TABS TABLET  Take 1 tablet (8.6 mg total) by mouth 2 (two) times daily.    SERTRALINE (ZOLOFT) 50 MG TABLET  Take 50 mg by mouth daily.         Physical Exam  Temperature 98.0 pulse 64 respirations 18 blood pressure 122/74 CBGs appear to be in the low mid 100s-weight is 153 this has been stable the past week. About a gain of about 5 pounds over the past several weeks Constitutional: She is well-developed, well-nourished, and in no distress.  HENT:  Head: Normocephalic and atraumatic.  Nose: Nose normal.  Mouth/Throat: Oropharynx is clear and moist. No oropharyngeal exudate.  Eyes: Conjunctivae and EOM are normal. Pupils are equal, round, and reactive to light.  .  Cardiovascular: Normal rate and normal heart sounds.--Does not really have significant lower extremity edema  Pulmonary/Chest: Effort normal and breath sounds normal. No labored breathing  Abdominal: Soft. Bowel sounds are normal. She exhibits no distension.  Musculoskeletal: She exhibits no edema--examination of the lower back did not elicit any deformities-she has some mild tenderness to palpation of the low back as she does move all her extremities x4 appears with baseline strength and range of motion  Neurological: She is alert--no lateralizing findings.  Skin: Skin is warm and dry.  Psychiatric-she is oriented to self pleasant and cooperative with exam.   Labs reviewed:  July 20,015.  WBC 5.4 hemoglobin 12.3 platelets 246.  Sodium 141 potassium 3.6 BUN 24 creatinine 0.89.  Liver function tests within normal limits except albumin of 3.4.  TSH-0.550    Basic Metabolic Panel:  Recent Labs   10/02/13 0340  10/03/13 0412  01/27/14 0918  01/27/14 2002  01/28/14 0405   NA  133*  131*  139  --  139   K  3.9  3.6  3.5*  --  3.7   CL  97  96  101  --  101   CO2   25  28  --  --  25   GLUCOSE  195*  175*  139*  --  152*   BUN  18  13  18   --  18   CREATININE  0.89  0.84  0.90  0.79  0.92   CALCIUM  9.0  8.7  --  --  9.1   Liver Function Tests:  Recent Labs   10/01/13 1159  01/28/14 0405   AST  20  13   ALT  15  10   ALKPHOS  65  76   BILITOT  0.4  0.6   PROT  6.1  5.6*   ALBUMIN  3.1*  2.7*   No results found for this basename: LIPASE, AMYLASE, in the last 8760 hours  No results found for this basename: AMMONIA, in the last 8760 hours  CBC:  Recent Labs   10/01/13 1159   01/27/14 0912   01/30/14 0054  01/31/14 0410  02/01/14 0458   WBC  8.5  < >  7.4  < >  7.6  7.5  6.9   NEUTROABS  6.9  --  5.9  --  --  --  --   HGB  13.4  < >  13.1  < >  11.1*  11.5*  11.5*   HCT  39.5  < >  39.6  < >  32.7*  34.5*  34.4*   MCV  87.8  < >  84.1  < >  83.4  83.3  83.1   PLT  224  < >  219  < >  190  245  236   < > = values in this interval not displayed.  Cardiac Enzymes:  Recent Labs   10/01/13 1159   TROPONINI  <0.30   BNP:  No components found with this basename: POCBNP,  CBG:  Recent Labs   01/30/14 0801  01/31/14 0755  02/01/14 0736   GLUCAP  132*  123*  114*   TSH:  Recent Labs   10/01/13 1159   TSH  1.552   A1C:  Lab Results   Component  Value  Date    HGBA1C  6.0*  01/28/2014   BC NO Diff (Complete Blood Count)  Result: 06/13/2014 9:51 AM ( Status: F ) C  WBC 5.4 4.0-10.5 K/uL SLN  RBC 4.34 3.87-5.11 MIL/uL SLN  Hemoglobin 12.3 12.0-15.0 g/dL SLN  Hematocrit 16.1 09.6-04.5 % SLN  MCV 82.9 78.0-100.0 fL SLN  MCH 28.3 26.0-34.0 pg SLN  MCHC 34.2 30.0-36.0 g/dL SLN  RDW 40.9 81.1-91.4 % SLN  Platelet Count 246 150-400 K/uL SLN  Comprehensive Metabolic Panel  Result: 06/13/2014 1:17 PM ( Status: F )  Sodium 141 135-145 mEq/L SLN  Potassium 3.6 3.5-5.3 mEq/L SLN  Chloride 106 96-112 mEq/L SLN  CO2 23 19-32 mEq/L SLN  Glucose 114 H 70-99 mg/dL SLN  BUN 24 H 7-82 mg/dL SLN  Creatinine 9.56 2.13-0.86 mg/dL SLN    Bilirubin, Total 0.5 0.2-1.2 mg/dL SLN  Alkaline Phosphatase 56 39-117 U/L SLN  AST/SGOT 13 0-37 U/L SLN  ALT/SGPT 8 0-35 U/L SLN  Total Protein 5.6 L 6.0-8.3 g/dL SLN  Albumin 3.4 L 5.7-8.4 g/dL SLN  Calcium 9.3 6.9-62.9 mg/dL SLN  TSH, Ultrasensitive  Result: 06/13/2014 10:35 AM ( Status: F )  TSH 0.550 0.350-4.500 uIU/m  Assessment/Plan  1. Dementia, without behavioral disturbance  Stable at this time, pt without acute cognitive or functional decline in the lastcouple months  2. Depression  Mood is improved on zoloft, no side effects noted  3. Compression fracture of L2 lumbar vertebra, with routine healing, subsequent encounter  Doing well with therapy, continues with some chronic pain complaints according to staff--challenging situation with codeine allergy-at this point will encourage Tylenol use with expedient followup by PCP-  x-ray lumbar area before discharge to insure stability-  4. Gait instability  -conts with PT/OT --withhome health--will need a  rolling walker to assist with her therapy and for hopefully progression with ambulation 5. Anemia, unspecified anemia type  -Hgb 12.3 on recent lab which is stable, we'll update before discharge notify primary care provider for results Diabetes type 2-this appears stable on low-dose Glucophage Will update metabolic panel before discharge--notify PCP of results.  #7-hypothyroidism she is on low-dose Synthroid recent TSH was within normal limits but on the low end we'll update this as well  #8 atrial fibrillation this appears to be rate controlled in the low 60s today-she is on Pradaxa  for anticoagulation  [id number]-of note greater than 30 minutes spent on this discharge summary

## 2015-01-25 ENCOUNTER — Non-Acute Institutional Stay (SKILLED_NURSING_FACILITY): Payer: Commercial Managed Care - HMO | Admitting: Registered Nurse

## 2015-01-25 DIAGNOSIS — E039 Hypothyroidism, unspecified: Secondary | ICD-10-CM

## 2015-01-25 DIAGNOSIS — F0391 Unspecified dementia with behavioral disturbance: Secondary | ICD-10-CM | POA: Diagnosis not present

## 2015-01-25 DIAGNOSIS — N3000 Acute cystitis without hematuria: Secondary | ICD-10-CM

## 2015-01-25 DIAGNOSIS — I482 Chronic atrial fibrillation, unspecified: Secondary | ICD-10-CM

## 2015-01-25 DIAGNOSIS — K219 Gastro-esophageal reflux disease without esophagitis: Secondary | ICD-10-CM

## 2015-01-25 DIAGNOSIS — N3281 Overactive bladder: Secondary | ICD-10-CM | POA: Diagnosis not present

## 2015-01-25 DIAGNOSIS — E119 Type 2 diabetes mellitus without complications: Secondary | ICD-10-CM

## 2015-01-25 DIAGNOSIS — I10 Essential (primary) hypertension: Secondary | ICD-10-CM

## 2015-01-25 DIAGNOSIS — M6281 Muscle weakness (generalized): Secondary | ICD-10-CM

## 2015-01-25 NOTE — Progress Notes (Signed)
Patient ID: Dawn Bradshaw, female   DOB: [birth year], 81 y.o.   MRN: [id number]   Place of Service: [facility]  Allergies  Allergen Reactions  . Oxybutynin   . Codeine Hives, Itching and Rash    Code Status: Full Code  Goals of Care: Longevity/STR  Chief Complaint  Patient presents with  . Hospitalization Follow-up    HPI 81 y.o. female with PMH of AD, OAB, depression, HTN among others  is being seen for a follow-up visit post hospital admission from 01/21/15 to 01/24/15 for pansensitive E. Coli UTI. She is here for short-term rehabilitation. She has been cared for by her family at home and the goal is for her to return there. Seen in room today. Denies any concerns.   Review of Systems Constitutional: Negative for fever and chills. HENT: Negative for ear pain and sore throat Eyes: Negative for eye pain and eye discharge Cardiovascular: Negative for chest pain and leg swelling Respiratory: Negative cough and shortness of breath Gastrointestinal: Negative for nausea and vomiting. Negative for abdominal pain, diarrhea and constipation.  Genitourinary: Negative for dysuria Musculoskeletal: Negative for back pain and joint pain Neurological: Negative for dizziness and headache Skin: Negative for rash and wound.   Psychiatric: Negative for depression  Past Medical History  Diagnosis Date  . Hyperlipidemia   . Thyroid disease     hypothyroidism  . Psoriasis   . Osteopenia   . LBP (low back pain)     mild  . Palpitations     H/O  . Diabetes mellitus     Type II  . Alzheimer's disease     mild  . Arrhythmia 4/12    A-Fib @ [facility]  . Atrial fibrillation   . Stroke   . Hypertension     Past Surgical History  Procedure Laterality Date  . Cleft palate repair    . Myomectomy  1980  . Colonoscopy  1999  . Orif hip fracture  08/2002  . Cataract extraction  08/2008  . Femur im nail Right 10/01/2013    Procedure: INTRAMEDULLARY (IM) NAIL FEMORAL;  Surgeon: Nestor Lewandowsky, MD;  Location: [facility];  Service: Orthopedics;  Laterality: Right;    History  Substance Use Topics  . Smoking status: Former Smoker    Types: Cigarettes    Quit date: 11/25/1948  . Smokeless tobacco: Never Used  . Alcohol Use: No    Family History  Problem Relation Age of Onset  . Heart disease Brother       Medication List       This list is accurate as of: 01/25/15  1:15 PM.  Always use your most recent med list.               amLODipine 2.5 MG tablet  Commonly known as:  NORVASC  Take 1 tablet (2.5 mg total) by mouth daily.     donepezil 23 MG Tabs tablet  Commonly known as:  ARICEPT  Take 23 mg by mouth at bedtime.     galantamine 8 MG tablet  Commonly known as:  RAZADYNE  Take 8 mg by mouth 2 (two) times daily.     losartan-hydrochlorothiazide 100-12.5 MG per tablet  Commonly known as:  HYZAAR  Take 1 tablet by mouth daily.     metFORMIN 500 MG tablet  Commonly known as:  GLUCOPHAGE  Take 500 mg by mouth daily with breakfast.     omeprazole 20 MG capsule  Commonly known  as:  PRILOSEC  Take 20 mg by mouth daily.     PRADAXA 150 MG Caps capsule  Generic drug:  dabigatran  Take 150 mg by mouth daily.     SYNTHROID 25 MCG tablet  Generic drug:  levothyroxine  Take 12.5 mcg by mouth daily.     trospium 20 MG tablet  Commonly known as:  SANCTURA  Take 20 mg by mouth 2 (two) times daily.        Physical Exam  BP 117/63 mmHg  Pulse 66  Temp(Src) 97 F (36.1 C)  Resp 20  Ht 5\' 6"  (1.676 m)  Wt 134 lb 11.2 oz (61.1 kg)  BMI 21.75 kg/m2  Constitutional: Frail elderly female in no acute distress. Conversant and pleasantly confused HEENT: Normocephalic and atraumatic. PERRL. EOM intact. No scleral icterus.No nasal discharge or sinus tenderness. Oral mucosa moist. Posterior pharynx clear of any exudate or lesions.  Neck: Supple and nontender. No lymphadenopathy, masses, or thyromegaly. No JVD or carotid bruits. Cardiac: Normal S1, S2. RRR  without appreciable murmurs, rubs, or gallops. Distal pulses intact. Trace dependent edema.  Lungs: No respiratory distress. Breath sounds clear bilaterally without rales, rhonchi, or wheezes. Abdomen: Audible bowel sounds in all quadrants. Soft, nontender, nondistended.  Musculoskeletal: Able to move all extremities. No joint erythema or tenderness.  Skin: Warm and dry. No rash noted. No erythema.  Neurological: Alert  Psychiatric: Appropriate mood and affect.   Labs Reviewed Reviewed. Refer to discharge summary for details.   Diagnostic Studies Reviewed Reviewed. See discharge summary for details  Assessment & Plan 1. Chronic atrial fibrillation Rate-controlled. Continue pradaxa for anticoagulation and monitor  2. Hypothyroidism, unspecified hypothyroidism type Continue synthroid 12.5mcg daily and monitor  3. Acute cystitis without hematuria Continue and complete Levaquin 500mg  daily x 3 more days. Encourage fluid intake and monitor  4. Muscle weakness (generalized) Continue to work with PT/OT for gait/strength/balance training to restore/maximize functions. Fall risk precautions  5. Diabetes mellitus without complication Continue metformin 500mg  daily. Monitor cbg daily. Continue to monitor her status  6. Dementia, with behavioral disturbance Continue aricept 23mg  daily with galantamine 8mg  twice daily. Continue to monitor for change in behaviors. Restart ativan 1mg  daily at bedtime as needed for agitation per family request. Continue assist with ADL care. Fall and pressure ulcer precautions.   7. Essential hypertension Continue norvasc 2.5mg  daily with hyzaar 100/12.5mg  daily and monitor  8. Gastroesophageal reflux disease, esophagitis presence not specified Continue omeprazole 20mg  daily   9. OAB (overactive bladder) Continue sanctura 20mg  twice daily and monitor.   Time spent: 40 minutes on care coordination   Family/Staff Communication Plan of care discussed with  son, Dawn Bradshaw and nursing staff. Son and nursing staff verbalized understanding and agree with plan of care. No additional questions or concerns reported.    Dawn BackKim Diar Berkel, MSN, AGNP-C [facility] [address] [place], [place] [address] [phone] [8am-5pm] After hours: [phone]

## 2015-01-26 ENCOUNTER — Non-Acute Institutional Stay (SKILLED_NURSING_FACILITY): Payer: Commercial Managed Care - HMO | Admitting: Internal Medicine

## 2015-01-26 DIAGNOSIS — K219 Gastro-esophageal reflux disease without esophagitis: Secondary | ICD-10-CM | POA: Diagnosis not present

## 2015-01-26 DIAGNOSIS — I482 Chronic atrial fibrillation, unspecified: Secondary | ICD-10-CM

## 2015-01-26 DIAGNOSIS — B9620 Unspecified Escherichia coli [E. coli] as the cause of diseases classified elsewhere: Secondary | ICD-10-CM

## 2015-01-26 DIAGNOSIS — R5381 Other malaise: Secondary | ICD-10-CM

## 2015-01-26 DIAGNOSIS — E119 Type 2 diabetes mellitus without complications: Secondary | ICD-10-CM

## 2015-01-26 DIAGNOSIS — N390 Urinary tract infection, site not specified: Secondary | ICD-10-CM | POA: Diagnosis not present

## 2015-01-26 DIAGNOSIS — I10 Essential (primary) hypertension: Secondary | ICD-10-CM | POA: Diagnosis not present

## 2015-01-26 DIAGNOSIS — F0391 Unspecified dementia with behavioral disturbance: Secondary | ICD-10-CM

## 2015-01-26 DIAGNOSIS — E039 Hypothyroidism, unspecified: Secondary | ICD-10-CM | POA: Diagnosis not present

## 2015-01-26 DIAGNOSIS — N3281 Overactive bladder: Secondary | ICD-10-CM

## 2015-01-26 NOTE — Progress Notes (Signed)
Patient ID: Dawn Bradshaw, female   DOB: Mar 18, 1927, 81 y.o.   MRN: [id number]     Facility: [facility]    PCP: Garlan Fillers, MD  Code Status: full code  Allergies  Allergen Reactions  . Oxybutynin   . Codeine Hives, Itching and Rash    Chief Complaint  Patient presents with  . New Admit To SNF     HPI:  81 year old patient is here for short term rehabilitation post hospital admission from 01/21/15-01/24/15 with lethargy, poor intake and confusion. She was diagnosed with e.coli UTI. She was started on iv fluids and antibiotics and later transitioned to po antibiotics.  She has pmh of afib, dementia, HLD,DM. She is seen in her room today. She is in no distress, is pleasantly confused. No new concern from patient or staff.  Review of Systems:  Constitutional: positive for fatigue. Negative for fever, chills, diaphoresis.  HENT: Negative for headache, congestion, nasal discharge Eyes: Negative for eye pain, blurred vision, double vision and discharge.  Respiratory: Negative for cough, shortness of breath and wheezing.   Cardiovascular: Negative for chest pain, palpitations, leg swelling.  Gastrointestinal: Negative for heartburn, nausea, vomiting, abdominal pain Genitourinary: Negative for dysuria, flank pain.  Musculoskeletal: Negative for back pain, falls. On wheelchair Skin: Negative for itching, rash.  Neurological: Negative for dizziness, tingling, focal weakness Psychiatric/Behavioral: Negative for depression. Has memory loss.    Past Medical History  Diagnosis Date  . Hyperlipidemia   . Thyroid disease     hypothyroidism  . Psoriasis   . Osteopenia   . LBP (low back pain)     mild  . Palpitations     H/O  . Diabetes mellitus     Type II  . Alzheimer's disease     mild  . Arrhythmia 4/12    A-Fib @ [facility]  . Atrial fibrillation   . Stroke   . Hypertension    Past Surgical History  Procedure Laterality Date  . Cleft palate  repair    . Myomectomy  1980  . Colonoscopy  1999  . Orif hip fracture  08/2002  . Cataract extraction  08/2008  . Femur im nail Right 10/01/2013    Procedure: INTRAMEDULLARY (IM) NAIL FEMORAL;  Surgeon: Nestor Lewandowsky, MD;  Location: [facility];  Service: Orthopedics;  Laterality: Right;   Social History:   reports that she quit smoking about 66 years ago. Her smoking use included Cigarettes. She has never used smokeless tobacco. She reports that she does not drink alcohol or use illicit drugs.  Family History  Problem Relation Age of Onset  . Heart disease Brother     Medications: Patient's Medications  New Prescriptions   No medications on file  Previous Medications   AMLODIPINE (NORVASC) 2.5 MG TABLET    Take 1 tablet (2.5 mg total) by mouth daily.   DABIGATRAN (PRADAXA) 150 MG CAPS    Take 150 mg by mouth daily.    DONEPEZIL (ARICEPT) 23 MG TABS TABLET    Take 23 mg by mouth at bedtime.   GALANTAMINE (RAZADYNE) 8 MG TABLET    Take 8 mg by mouth 2 (two) times daily.   LEVOTHYROXINE (SYNTHROID) 25 MCG TABLET    Take 12.5 mcg by mouth daily.    LOSARTAN-HYDROCHLOROTHIAZIDE (HYZAAR) 100-12.5 MG PER TABLET    Take 1 tablet by mouth daily.    METFORMIN (GLUCOPHAGE) 500 MG TABLET    Take 500 mg by mouth daily  with breakfast.    OMEPRAZOLE (PRILOSEC) 20 MG CAPSULE    Take 20 mg by mouth daily.   TROSPIUM (SANCTURA) 20 MG TABLET    Take 20 mg by mouth 2 (two) times daily.  Modified Medications   No medications on file  Discontinued Medications   No medications on file     Physical Exam: Filed Vitals:   01/26/15 1549  BP: 120/70  Pulse: 79  Temp: 97.6 F (36.4 C)  Resp: 18  SpO2: 92%    General- elderly female, thin built, frail,  in no acute distress Head- normocephalic, atraumatic Throat- moist mucus membrane Eyes- PERRLA, EOMI, no pallor, no icterus, no discharge, normal conjunctiva, normal sclera, wears glasses Neck- no cervical lymphadenopathy Cardiovascular- irregular  heart rate, no murmurs, trace leg edema Respiratory- bilateral clear to auscultation, no wheeze, no rhonchi, no crackles, no use of accessory muscles Abdomen- bowel sounds present, soft, non tender Musculoskeletal- able to move all 4 extremities, generalized weakness Neurological- no focal deficit Skin- warm and dry Psychiatry- normal mood and affect    Labs reviewed: Basic Metabolic Panel:  Recent Labs  16/08/9602/05/15 0918 01/27/14 2002 01/28/14 0405  NA 139  --  139  K 3.5*  --  3.7  CL 101  --  101  CO2  --   --  25  GLUCOSE 139*  --  152*  BUN 18  --  18  CREATININE 0.90 0.79 0.92  CALCIUM  --   --  9.1   Liver Function Tests:  Recent Labs  01/28/14 0405  AST 13  ALT 10  ALKPHOS 76  BILITOT 0.6  PROT 5.6*  ALBUMIN 2.7*   No results for input(s): LIPASE, AMYLASE in the last 8760 hours. No results for input(s): AMMONIA in the last 8760 hours. CBC:  Recent Labs  01/27/14 0912  01/30/14 0054 01/31/14 0410 02/01/14 0458  WBC 7.4  < > 7.6 7.5 6.9  NEUTROABS 5.9  --   --   --   --   HGB 13.1  < > 11.1* 11.5* 11.5*  HCT 39.6  < > 32.7* 34.5* 34.4*  MCV 84.1  < > 83.4 83.3 83.1  PLT 219  < > 190 245 236  < > = values in this interval not displayed. Cardiac Enzymes: No results for input(s): CKTOTAL, CKMB, CKMBINDEX, TROPONINI in the last 8760 hours. BNP: Invalid input(s): POCBNP CBG:  Recent Labs  01/30/14 0801 01/31/14 0755 02/01/14 0736  GLUCAP 132* 123* 114*    Assessment/Plan  Physical deconditioning Will have her work with physical therapy and occupational therapy team to help with gait training and muscle strengthening exercises.fall precautions. Skin care. Encourage to be out of bed.   E.coli uti Complete course of levaquin, encourage hydration  Chronic atrial fibrillation Rate-controlled. Continue pradaxa for anticoagulation and monitor  Dementia Continue aricept 23mg  daily with galantamine 8mg  twice daily. Continue ativan 1mg  daily at  bedtime as needed for behavioral changes/ agitation. Pressure ulcer prophylaxis, fall precuations and assistance with ADLs to be provided  Diabetes mellitus  Continue metformin 500mg  daily. Monitor cbg   Hypothyroidism Continue synthroid 12.5mcg daily  gerd Stable on omeprazole 20 mg daily  Essential hypertension Continue norvasc 2.5mg  daily with hyzaar 100/12.5mg  daily and monitor  OAB  Continue sanctura 20mg  twice daily and monitor. Monitor for signs/ symptoms of uti   Goals of care: short term rehabilitation   Labs/tests ordered: cbc with diff, cmp in 1 week  Family/ staff Communication: reviewed care  plan with patient and nursing supervisor    Blanchie Serve, MD  [facility] [phone] (Monday-Friday 8 am - 5 pm) [phone] (afterhours)

## 2015-02-02 LAB — BASIC METABOLIC PANEL
BUN: 27 mg/dL — AB (ref 4–21)
CREATININE: 1.2 mg/dL — AB (ref 0.5–1.1)
Glucose: 94 mg/dL
Potassium: 4.1 mmol/L (ref 3.4–5.3)
Sodium: 140 mmol/L (ref 137–147)

## 2015-02-02 LAB — CBC AND DIFFERENTIAL
HEMATOCRIT: 34 % — AB (ref 36–46)
HEMOGLOBIN: 11.5 g/dL — AB (ref 12.0–16.0)
Platelets: 219 10*3/uL (ref 150–399)
WBC: 5.8 10^3/mL

## 2015-02-08 ENCOUNTER — Non-Acute Institutional Stay (SKILLED_NURSING_FACILITY): Payer: Commercial Managed Care - HMO | Admitting: Registered Nurse

## 2015-02-08 DIAGNOSIS — N390 Urinary tract infection, site not specified: Secondary | ICD-10-CM | POA: Diagnosis not present

## 2015-02-08 DIAGNOSIS — I482 Chronic atrial fibrillation, unspecified: Secondary | ICD-10-CM

## 2015-02-08 DIAGNOSIS — N3281 Overactive bladder: Secondary | ICD-10-CM

## 2015-02-08 DIAGNOSIS — E039 Hypothyroidism, unspecified: Secondary | ICD-10-CM | POA: Diagnosis not present

## 2015-02-08 DIAGNOSIS — F0391 Unspecified dementia with behavioral disturbance: Secondary | ICD-10-CM | POA: Diagnosis not present

## 2015-02-08 DIAGNOSIS — K219 Gastro-esophageal reflux disease without esophagitis: Secondary | ICD-10-CM

## 2015-02-08 DIAGNOSIS — E119 Type 2 diabetes mellitus without complications: Secondary | ICD-10-CM

## 2015-02-08 DIAGNOSIS — M6281 Muscle weakness (generalized): Secondary | ICD-10-CM

## 2015-02-08 DIAGNOSIS — B9620 Unspecified Escherichia coli [E. coli] as the cause of diseases classified elsewhere: Secondary | ICD-10-CM

## 2015-02-08 DIAGNOSIS — I10 Essential (primary) hypertension: Secondary | ICD-10-CM | POA: Diagnosis not present

## 2015-02-09 ENCOUNTER — Encounter: Payer: Self-pay | Admitting: Registered Nurse

## 2015-02-09 NOTE — Progress Notes (Signed)
Patient ID: Dawn Bradshaw, female   DOB: May 05, 1927, 81 y.o.   MRN: [id number]   Place of Service: [facility]  Allergies  Allergen Reactions  . Oxybutynin   . Codeine Hives, Itching and Rash    Code Status: Full Code  Goals of Care: Longevity/STR  Chief Complaint  Patient presents with  . Discharge Note    HPI 81 y.o. female with PMH of AD, OAB, depression, HTN among others  is being seen for a discharge visit. She was here for short term rehabilitation post hospital admission from 01/21/15 to 01/24/15 for pansensitive E. Coli UTI and physical deconditioning/generalized weakness. She has worked well with therapy team and is ready to be discharged home with [facility] PT/OT. Seen in room today. Denies any concerns.    Review of Systems Constitutional: Negative for fever and chills. HENT: Negative for ear pain and sore throat Eyes: Negative for eye pain and eye discharge Cardiovascular: Negative for chest pain and leg swelling Respiratory: Negative cough and shortness of breath Gastrointestinal: Negative for nausea and vomiting. Negative for abdominal pain. Genitourinary: Negative for dysuria Musculoskeletal: Negative for back pain and joint pain Neurological: Negative for dizziness and headache Skin: Negative for rash and wound.   Psychiatric: Negative for depression  Past Medical History  Diagnosis Date  . Hyperlipidemia   . Thyroid disease     hypothyroidism  . Psoriasis   . Osteopenia   . LBP (low back pain)     mild  . Palpitations     H/O  . Diabetes mellitus     Type II  . Alzheimer's disease     mild  . Arrhythmia 4/12    A-Fib @ [facility]  . Atrial fibrillation   . Stroke   . Hypertension     Past Surgical History  Procedure Laterality Date  . Cleft palate repair    . Myomectomy  1980  . Colonoscopy  1999  . Orif hip fracture  08/2002  . Cataract extraction  08/2008  . Femur im nail Right 10/01/2013    Procedure: INTRAMEDULLARY (IM) NAIL FEMORAL;   Surgeon: Nestor Lewandowsky, MD;  Location: [facility];  Service: Orthopedics;  Laterality: Right;    History  Substance Use Topics  . Smoking status: Former Smoker    Types: Cigarettes    Quit date: 11/25/1948  . Smokeless tobacco: Never Used  . Alcohol Use: No    Family History  Problem Relation Age of Onset  . Heart disease Brother       Medication List       This list is accurate as of: 02/08/15 11:59 PM.  Always use your most recent med list.               amLODipine 2.5 MG tablet  Commonly known as:  NORVASC  Take 1 tablet (2.5 mg total) by mouth daily.     donepezil 23 MG Tabs tablet  Commonly known as:  ARICEPT  Take 23 mg by mouth at bedtime.     galantamine 8 MG tablet  Commonly known as:  RAZADYNE  Take 8 mg by mouth 2 (two) times daily.     LORazepam 1 MG tablet  Commonly known as:  ATIVAN  Take 1 mg by mouth at bedtime as needed for anxiety.     losartan-hydrochlorothiazide 100-12.5 MG per tablet  Commonly known as:  HYZAAR  Take 1 tablet by mouth daily.     metFORMIN 500 MG tablet  Commonly known as:  GLUCOPHAGE  Take 500 mg by mouth daily with breakfast.     omeprazole 20 MG capsule  Commonly known as:  PRILOSEC  Take 20 mg by mouth daily.     PRADAXA 150 MG Caps capsule  Generic drug:  dabigatran  Take 150 mg by mouth daily.     SYNTHROID 25 MCG tablet  Generic drug:  levothyroxine  Take 12.5 mcg by mouth daily.     trospium 20 MG tablet  Commonly known as:  SANCTURA  Take 20 mg by mouth 2 (two) times daily.        Physical Exam  BP 106/61 mmHg  Pulse 84  Temp(Src) 97.3 F (36.3 C)  Resp 18  Ht 5\' 4"  (1.626 m)  Wt 140 lb 3.2 oz (63.594 kg)  BMI 24.05 kg/m2  SpO2 96%  Constitutional: Frail elderly female in no acute distress. Conversant and pleasantly confused HEENT: Normocephalic and atraumatic. PERRL. EOM intact. No scleral icterus.Oral mucosa moist. Posterior pharynx clear of any exudate or lesions.  Neck: No lymphadenopathy,  masses, or thyromegaly. No JVD or carotid bruits. Cardiac: Normal S1, S2. RRR without appreciable murmurs, rubs, or gallops. Distal pulses intact. Trace dependent edema.  Lungs: No respiratory distress. Breath sounds clear bilaterally without rales, rhonchi, or wheezes. Abdomen: Audible bowel sounds in all quadrants. Soft, nontender, nondistended.  Musculoskeletal: Able to move all extremities. No joint erythema or tenderness.  Skin: Warm and dry. No rash noted. No erythema.  Neurological: Alert  Psychiatric: Appropriate mood and affect.   Labs Reviewed CBC Latest Ref Rng 02/02/2015 02/01/2014 01/31/2014  WBC - 5.8 6.9 7.5  Hemoglobin 12.0 - 16.0 g/dL 11.5(A) 11.5(L) 11.5(L)  Hematocrit 36 - 46 % 34(A) 34.4(L) 34.5(L)  Platelets 150 - 399 K/L 219 236 245    CMP Latest Ref Rng 02/02/2015 01/28/2014 01/27/2014  Glucose 70 - 99 mg/dL - 161(W152(H) -  BUN 4 - 21 mg/dL 96(E27(A) 18 -  Creatinine 0.5 - 1.1 mg/dL 1.2(A) 0.92 0.79  Sodium 137 - 147 mmol/L 140 139 -  Potassium 3.4 - 5.3 mmol/L 4.1 3.7 -  Chloride 96 - 112 mEq/L - 101 -  CO2 19 - 32 mEq/L - 25 -  Calcium 8.4 - 10.5 mg/dL - 9.1 -  Total Protein 6.0 - 8.3 g/dL - 5.6(L) -  Total Bilirubin 0.3 - 1.2 mg/dL - 0.6 -  Alkaline Phos 39 - 117 U/L - 76 -  AST 0 - 37 U/L - 13 -  ALT 0 - 35 U/L - 10 -    Assessment & Plan 1. Chronic atrial fibrillation Stable. Rate-controlled. Continue pradaxa for anticoagulation and monitor  2. Hypothyroidism, unspecified hypothyroidism type Stable. Continue synthroid 12.5mcg daily and monitor  3. E. Coli UTI Resolved. Completed abx course. Denies any dysuria.  Encourage fluid intake.   4. Muscle weakness (generalized) [facility] for gait/strength/balance training to restore/maximize functions. Fall risk precautions  5. Diabetes mellitus without complication Stable. CBG range 110-140s. Continue metformin 500mg  daily. Continue to f/u with PCP as scheduled  6. Dementia, with behavioral  disturbance Stable. Continue aricept 23mg  daily and galantamine 8mg  twice daily with ativan 1mg  daily at bedtime as needed for agitation/restlessness. Fall and pressure ulcer precautions.   7. Essential hypertension Stable. Continue norvasc 2.5mg  daily with hyzaar 100/12.5mg  daily and monitor  8. Gastroesophageal reflux disease, esophagitis presence not specified Stable. Continue omeprazole 20mg  daily   9. OAB (overactive bladder) Continue sanctura 20mg  twice daily  and monitor.   Home health services:PT/OT DME required: None PCP follow-up: Dr. Mathis Bud on 02/21/15 :15pm 30-day supply of prescription medications provided  Time spent: 40 minutes on care coordination   Family/Staff Communication Plan of care discussed with son, Kristeen Miss and nursing staff. Son and nursing staff verbalized understanding and agree with plan of care. No additional questions or concerns reported.    Loura Back, MSN, AGNP-C [facility] [address] [place], [place] [address] [phone] [8am-5pm] After hours: [phone]

## 2015-02-13 DIAGNOSIS — G309 Alzheimer's disease, unspecified: Secondary | ICD-10-CM | POA: Diagnosis not present

## 2015-02-13 DIAGNOSIS — M6281 Muscle weakness (generalized): Secondary | ICD-10-CM | POA: Diagnosis not present

## 2015-02-13 DIAGNOSIS — R482 Apraxia: Secondary | ICD-10-CM | POA: Diagnosis not present

## 2015-02-13 DIAGNOSIS — F0281 Dementia in other diseases classified elsewhere with behavioral disturbance: Secondary | ICD-10-CM | POA: Diagnosis not present

## 2015-02-20 ENCOUNTER — Emergency Department (HOSPITAL_COMMUNITY): Payer: Commercial Managed Care - HMO

## 2015-02-20 ENCOUNTER — Encounter (HOSPITAL_COMMUNITY): Payer: Self-pay | Admitting: Emergency Medicine

## 2015-02-20 ENCOUNTER — Inpatient Hospital Stay (HOSPITAL_COMMUNITY)
Admission: EM | Admit: 2015-02-20 | Discharge: 2015-02-23 | DRG: 281 | Disposition: A | Discharge status: Skilled Nursing Facility | Payer: Commercial Managed Care - HMO | Attending: Family Medicine | Admitting: Family Medicine

## 2015-02-20 DIAGNOSIS — E46 Unspecified protein-calorie malnutrition: Secondary | ICD-10-CM | POA: Diagnosis present

## 2015-02-20 DIAGNOSIS — B961 Klebsiella pneumoniae [K. pneumoniae] as the cause of diseases classified elsewhere: Secondary | ICD-10-CM | POA: Diagnosis present

## 2015-02-20 DIAGNOSIS — I340 Nonrheumatic mitral (valve) insufficiency: Secondary | ICD-10-CM | POA: Diagnosis present

## 2015-02-20 DIAGNOSIS — N390 Urinary tract infection, site not specified: Secondary | ICD-10-CM | POA: Diagnosis present

## 2015-02-20 DIAGNOSIS — E876 Hypokalemia: Secondary | ICD-10-CM | POA: Diagnosis not present

## 2015-02-20 DIAGNOSIS — I481 Persistent atrial fibrillation: Secondary | ICD-10-CM | POA: Diagnosis not present

## 2015-02-20 DIAGNOSIS — Z885 Allergy status to narcotic agent status: Secondary | ICD-10-CM

## 2015-02-20 DIAGNOSIS — Z79899 Other long term (current) drug therapy: Secondary | ICD-10-CM

## 2015-02-20 DIAGNOSIS — Z7901 Long term (current) use of anticoagulants: Secondary | ICD-10-CM

## 2015-02-20 DIAGNOSIS — E785 Hyperlipidemia, unspecified: Secondary | ICD-10-CM | POA: Diagnosis present

## 2015-02-20 DIAGNOSIS — E860 Dehydration: Secondary | ICD-10-CM | POA: Diagnosis present

## 2015-02-20 DIAGNOSIS — E039 Hypothyroidism, unspecified: Secondary | ICD-10-CM | POA: Diagnosis present

## 2015-02-20 DIAGNOSIS — I200 Unstable angina: Secondary | ICD-10-CM

## 2015-02-20 DIAGNOSIS — N170 Acute kidney failure with tubular necrosis: Secondary | ICD-10-CM | POA: Diagnosis not present

## 2015-02-20 DIAGNOSIS — I2119 ST elevation (STEMI) myocardial infarction involving other coronary artery of inferior wall: Secondary | ICD-10-CM | POA: Diagnosis present

## 2015-02-20 DIAGNOSIS — R630 Anorexia: Secondary | ICD-10-CM | POA: Diagnosis not present

## 2015-02-20 DIAGNOSIS — Z8673 Personal history of transient ischemic attack (TIA), and cerebral infarction without residual deficits: Secondary | ICD-10-CM | POA: Diagnosis not present

## 2015-02-20 DIAGNOSIS — R531 Weakness: Secondary | ICD-10-CM

## 2015-02-20 DIAGNOSIS — Z888 Allergy status to other drugs, medicaments and biological substances status: Secondary | ICD-10-CM

## 2015-02-20 DIAGNOSIS — I4891 Unspecified atrial fibrillation: Secondary | ICD-10-CM | POA: Diagnosis present

## 2015-02-20 DIAGNOSIS — N183 Chronic kidney disease, stage 3 (moderate): Secondary | ICD-10-CM | POA: Diagnosis present

## 2015-02-20 DIAGNOSIS — I129 Hypertensive chronic kidney disease with stage 1 through stage 4 chronic kidney disease, or unspecified chronic kidney disease: Secondary | ICD-10-CM | POA: Diagnosis present

## 2015-02-20 DIAGNOSIS — I480 Paroxysmal atrial fibrillation: Secondary | ICD-10-CM | POA: Diagnosis present

## 2015-02-20 DIAGNOSIS — G309 Alzheimer's disease, unspecified: Secondary | ICD-10-CM | POA: Diagnosis present

## 2015-02-20 DIAGNOSIS — E1122 Type 2 diabetes mellitus with diabetic chronic kidney disease: Secondary | ICD-10-CM | POA: Diagnosis present

## 2015-02-20 DIAGNOSIS — Z66 Do not resuscitate: Secondary | ICD-10-CM | POA: Diagnosis present

## 2015-02-20 DIAGNOSIS — Z87891 Personal history of nicotine dependence: Secondary | ICD-10-CM | POA: Diagnosis not present

## 2015-02-20 DIAGNOSIS — L409 Psoriasis, unspecified: Secondary | ICD-10-CM | POA: Diagnosis present

## 2015-02-20 DIAGNOSIS — I482 Chronic atrial fibrillation: Secondary | ICD-10-CM

## 2015-02-20 DIAGNOSIS — I213 ST elevation (STEMI) myocardial infarction of unspecified site: Secondary | ICD-10-CM | POA: Diagnosis present

## 2015-02-20 DIAGNOSIS — M8580 Other specified disorders of bone density and structure, unspecified site: Secondary | ICD-10-CM | POA: Diagnosis present

## 2015-02-20 DIAGNOSIS — N12 Tubulo-interstitial nephritis, not specified as acute or chronic: Secondary | ICD-10-CM | POA: Diagnosis present

## 2015-02-20 DIAGNOSIS — F0280 Dementia in other diseases classified elsewhere without behavioral disturbance: Secondary | ICD-10-CM | POA: Diagnosis present

## 2015-02-20 DIAGNOSIS — Z515 Encounter for palliative care: Secondary | ICD-10-CM

## 2015-02-20 DIAGNOSIS — R079 Chest pain, unspecified: Secondary | ICD-10-CM | POA: Diagnosis not present

## 2015-02-20 DIAGNOSIS — E032 Hypothyroidism due to medicaments and other exogenous substances: Secondary | ICD-10-CM | POA: Diagnosis not present

## 2015-02-20 DIAGNOSIS — N179 Acute kidney failure, unspecified: Secondary | ICD-10-CM | POA: Diagnosis present

## 2015-02-20 DIAGNOSIS — R001 Bradycardia, unspecified: Secondary | ICD-10-CM | POA: Diagnosis not present

## 2015-02-20 LAB — COMPREHENSIVE METABOLIC PANEL
ALBUMIN: 3.2 g/dL — AB (ref 3.5–5.2)
ALT: 9 U/L (ref 0–35)
AST: 16 U/L (ref 0–37)
Alkaline Phosphatase: 77 U/L (ref 39–117)
Anion gap: 12 (ref 5–15)
BUN: 23 mg/dL (ref 6–23)
CO2: 25 mmol/L (ref 19–32)
CREATININE: 1.89 mg/dL — AB (ref 0.50–1.10)
Calcium: 9 mg/dL (ref 8.4–10.5)
Chloride: 102 mmol/L (ref 96–112)
GFR calc Af Amer: 27 mL/min — ABNORMAL LOW (ref >90)
GFR calc non Af Amer: 23 mL/min — ABNORMAL LOW (ref >90)
Glucose, Bld: 105 mg/dL — ABNORMAL HIGH (ref 70–99)
Potassium: 3.8 mmol/L (ref 3.5–5.1)
SODIUM: 139 mmol/L (ref 135–145)
Total Bilirubin: 1.2 mg/dL (ref 0.3–1.2)
Total Protein: 6.4 g/dL (ref 6.0–8.3)

## 2015-02-20 LAB — URINALYSIS, ROUTINE W REFLEX MICROSCOPIC
Glucose, UA: NEGATIVE mg/dL
Ketones, ur: 15 mg/dL — AB
Nitrite: POSITIVE — AB
PROTEIN: 100 mg/dL — AB
Specific Gravity, Urine: 1.023 (ref 1.005–1.030)
UROBILINOGEN UA: 1 mg/dL (ref 0.0–1.0)
pH: 5 (ref 5.0–8.0)

## 2015-02-20 LAB — URINE MICROSCOPIC-ADD ON

## 2015-02-20 LAB — CBC WITH DIFFERENTIAL/PLATELET
BASOS ABS: 0 10*3/uL (ref 0.0–0.1)
BASOS PCT: 0 % (ref 0–1)
Eosinophils Absolute: 0 10*3/uL (ref 0.0–0.7)
Eosinophils Relative: 0 % (ref 0–5)
HEMATOCRIT: 39.3 % (ref 36.0–46.0)
HEMOGLOBIN: 12.7 g/dL (ref 12.0–15.0)
Lymphocytes Relative: 16 % (ref 12–46)
Lymphs Abs: 1.3 10*3/uL (ref 0.7–4.0)
MCH: 27.9 pg (ref 26.0–34.0)
MCHC: 32.3 g/dL (ref 30.0–36.0)
MCV: 86.4 fL (ref 78.0–100.0)
Monocytes Absolute: 1.1 10*3/uL — ABNORMAL HIGH (ref 0.1–1.0)
Monocytes Relative: 14 % — ABNORMAL HIGH (ref 3–12)
NEUTROS ABS: 5.6 10*3/uL (ref 1.7–7.7)
NEUTROS PCT: 70 % (ref 43–77)
Platelets: 248 10*3/uL (ref 150–400)
RBC: 4.55 MIL/uL (ref 3.87–5.11)
RDW: 14.2 % (ref 11.5–15.5)
WBC: 8.1 10*3/uL (ref 4.0–10.5)

## 2015-02-20 LAB — TROPONIN I: Troponin I: <0.03 ng/mL (ref <0.031)

## 2015-02-20 LAB — I-STAT TROPONIN, ED: Troponin i, poc: 0 ng/mL (ref 0.00–0.08)

## 2015-02-20 LAB — GLUCOSE, CAPILLARY: Glucose-Capillary: 109 mg/dL — ABNORMAL HIGH (ref 70–99)

## 2015-02-20 LAB — PROTIME-INR
INR: 1.6 — AB (ref 0.00–1.49)
PROTHROMBIN TIME: 19.2 s — AB (ref 11.6–15.2)

## 2015-02-20 MED ORDER — ASPIRIN 81 MG PO CHEW
324.0000 mg | CHEWABLE_TABLET | Freq: Once | ORAL | Status: AC
  Administered 2015-02-20: 324 mg via ORAL
  Filled 2015-02-20: qty 4

## 2015-02-20 MED ORDER — SODIUM CHLORIDE 0.9 % IJ SOLN
3.0000 mL | Freq: Two times a day (BID) | INTRAMUSCULAR | Status: DC
  Administered 2015-02-23: 3 mL via INTRAVENOUS

## 2015-02-20 MED ORDER — NITROGLYCERIN 0.4 MG SL SUBL: 0.4000 mg | SUBLINGUAL_TABLET | SUBLINGUAL | Status: DC | PRN

## 2015-02-20 MED ORDER — LORAZEPAM 0.5 MG PO TABS
0.5000 mg | ORAL_TABLET | Freq: Four times a day (QID) | ORAL | Status: DC | PRN
Start: 2015-02-20 — End: 2015-02-23

## 2015-02-20 MED ORDER — ALUM & MAG HYDROXIDE-SIMETH 200-200-20 MG/5ML PO SUSP: 30.0000 mL | Freq: Four times a day (QID) | ORAL | Status: DC | PRN

## 2015-02-20 MED ORDER — HYDROCODONE-ACETAMINOPHEN 5-325 MG PO TABS
1.0000 | ORAL_TABLET | ORAL | Status: DC | PRN
Start: 2015-02-20 — End: 2015-02-23
  Administered 2015-02-23: 1 via ORAL
  Administered 2015-02-23: 2 via ORAL
  Filled 2015-02-20: qty 1
  Filled 2015-02-20: qty 2

## 2015-02-20 MED ORDER — ASPIRIN 81 MG PO CHEW
81.0000 mg | CHEWABLE_TABLET | Freq: Every day | ORAL | Status: DC
  Administered 2015-02-20 – 2015-02-22 (×3): 81 mg via ORAL
  Filled 2015-02-20 (×3): qty 1

## 2015-02-20 MED ORDER — GUAIFENESIN-DM 100-10 MG/5ML PO SYRP: 5.0000 mL | ORAL_SOLUTION | ORAL | Status: DC | PRN

## 2015-02-20 MED ORDER — PANTOPRAZOLE SODIUM 40 MG PO TBEC
40.0000 mg | DELAYED_RELEASE_TABLET | Freq: Every day | ORAL | Status: DC
  Administered 2015-02-21 – 2015-02-23 (×3): 40 mg via ORAL
  Filled 2015-02-20 (×3): qty 1

## 2015-02-20 MED ORDER — GALANTAMINE HYDROBROMIDE 4 MG PO TABS
8.0000 mg | ORAL_TABLET | Freq: Two times a day (BID) | ORAL | Status: DC
  Administered 2015-02-20 – 2015-02-23 (×6): 8 mg via ORAL
  Filled 2015-02-20 (×7): qty 2

## 2015-02-20 MED ORDER — HEPARIN (PORCINE) IN NACL 100-0.45 UNIT/ML-% IJ SOLN
750.0000 [IU]/h | INTRAMUSCULAR | Status: DC
  Administered 2015-02-21: 750 [IU]/h via INTRAVENOUS
  Filled 2015-02-20: qty 250

## 2015-02-20 MED ORDER — ONDANSETRON HCL 4 MG/2ML IJ SOLN: 4.0000 mg | Freq: Four times a day (QID) | INTRAMUSCULAR | Status: DC | PRN

## 2015-02-20 MED ORDER — ONDANSETRON HCL 4 MG PO TABS: 4.0000 mg | ORAL_TABLET | Freq: Four times a day (QID) | ORAL | Status: DC | PRN

## 2015-02-20 MED ORDER — DONEPEZIL HCL 23 MG PO TABS
23.0000 mg | ORAL_TABLET | Freq: Every day | ORAL | Status: DC
  Administered 2015-02-21 – 2015-02-22 (×2): 23 mg via ORAL
  Filled 2015-02-20 (×4): qty 1

## 2015-02-20 MED ORDER — POLYETHYLENE GLYCOL 3350 17 G PO PACK: 17.0000 g | PACK | Freq: Every day | ORAL | Status: DC | PRN

## 2015-02-20 MED ORDER — CEFTRIAXONE SODIUM IN DEXTROSE 20 MG/ML IV SOLN
1.0000 g | INTRAVENOUS | Status: DC
  Administered 2015-02-21: 1 g via INTRAVENOUS
  Filled 2015-02-20 (×2): qty 50

## 2015-02-20 MED ORDER — CEFTRIAXONE SODIUM 1 G IJ SOLR
1.0000 g | Freq: Once | INTRAMUSCULAR | Status: DC
  Administered 2015-02-20: 1 g via INTRAVENOUS
  Filled 2015-02-20: qty 10

## 2015-02-20 MED ORDER — INSULIN ASPART 100 UNIT/ML ~~LOC~~ SOLN: 0.0000 [IU] | Freq: Three times a day (TID) | SUBCUTANEOUS | Status: DC

## 2015-02-20 MED ORDER — AMLODIPINE BESYLATE 2.5 MG PO TABS
2.5000 mg | ORAL_TABLET | Freq: Every day | ORAL | Status: DC
  Administered 2015-02-21 – 2015-02-22 (×2): 2.5 mg via ORAL
  Filled 2015-02-20 (×2): qty 1

## 2015-02-20 MED ORDER — INSULIN ASPART 100 UNIT/ML ~~LOC~~ SOLN: 0.0000 [IU] | Freq: Every day | SUBCUTANEOUS | Status: DC

## 2015-02-20 MED ORDER — SODIUM CHLORIDE 0.9 % IV BOLUS (SEPSIS)
1000.0000 mL | Freq: Once | INTRAVENOUS | Status: AC
  Administered 2015-02-20: 1000 mL via INTRAVENOUS

## 2015-02-20 MED ORDER — LEVOTHYROXINE SODIUM 25 MCG PO TABS
12.5000 ug | ORAL_TABLET | Freq: Every day | ORAL | Status: DC
Start: 2015-02-21 — End: 2015-02-23
  Administered 2015-02-21 – 2015-02-23 (×3): 12.5 ug via ORAL
  Filled 2015-02-20 (×3): qty 1

## 2015-02-20 MED ORDER — SODIUM CHLORIDE 0.9 % IV SOLN
INTRAVENOUS | Status: DC
  Administered 2015-02-20 – 2015-02-21 (×2): via INTRAVENOUS

## 2015-02-20 NOTE — ED Provider Notes (Signed)
CSN: [id number]     Arrival date & time 02/20/15  1551 History   First MD Initiated Contact with Patient 02/20/15 1559     No chief complaint on file.    (Consider location/radiation/quality/duration/timing/severity/associated sxs/prior Treatment) HPI  81 year old female presents with weakness. History is taken from EMS due to patient's dementia. Family told them that she has been weak for last couple days, similar to prior UTIs. Has also had urinary frequency. Family endorse that she has complaint of some chest pain and EMS had EKGs that show some ST elevations inferiorly. There were no reciprocal changes on her EKG. Patient denies chest pain or chest pressure to me. When asked about shortness of breath she said yes, I asked her later and she stated this was gone. She has no current complaints to me. No family at bedside on arrival.  Past Medical History  Diagnosis Date  . Hyperlipidemia   . Thyroid disease     hypothyroidism  . Psoriasis   . Osteopenia   . LBP (low back pain)     mild  . Palpitations     H/O  . Diabetes mellitus     Type II  . Alzheimer's disease     mild  . Arrhythmia 4/12    A-Fib @ [facility]  . Atrial fibrillation   . Stroke   . Hypertension    Past Surgical History  Procedure Laterality Date  . Cleft palate repair    . Myomectomy  1980  . Colonoscopy  1999  . Orif hip fracture  08/2002  . Cataract extraction  08/2008  . Femur im nail Right 10/01/2013    Procedure: INTRAMEDULLARY (IM) NAIL FEMORAL;  Surgeon: Nestor Lewandowsky, MD;  Location: [facility];  Service: Orthopedics;  Laterality: Right;   Family History  Problem Relation Age of Onset  . Heart disease Brother    History  Substance Use Topics  . Smoking status: Former Smoker    Types: Cigarettes    Quit date: 11/25/1948  . Smokeless tobacco: Never Used  . Alcohol Use: No   OB History    No data available     Review of Systems  Unable to perform ROS: Dementia      Allergies  Oxybutynin  and Codeine  Home Medications   Prior to Admission medications   Medication Sig Start Date End Date Taking? Authorizing Provider  amLODipine (NORVASC) 2.5 MG tablet Take 1 tablet (2.5 mg total) by mouth daily. 02/01/14   Jarome Matin, MD  dabigatran (PRADAXA) 150 MG CAPS Take 150 mg by mouth daily.     Historical Provider, MD  donepezil (ARICEPT) 23 MG TABS tablet Take 23 mg by mouth at bedtime.    Historical Provider, MD  galantamine (RAZADYNE) 8 MG tablet Take 8 mg by mouth 2 (two) times daily.    Historical Provider, MD  levothyroxine (SYNTHROID) 25 MCG tablet Take 12.5 mcg by mouth daily.     Historical Provider, MD  LORazepam (ATIVAN) 1 MG tablet Take 1 mg by mouth at bedtime as needed for anxiety.    Historical Provider, MD  losartan-hydrochlorothiazide (HYZAAR) 100-12.5 MG per tablet Take 1 tablet by mouth daily.     Historical Provider, MD  metFORMIN (GLUCOPHAGE) 500 MG tablet Take 500 mg by mouth daily with breakfast.     Historical Provider, MD  omeprazole (PRILOSEC) 20 MG capsule Take 20 mg by mouth daily.    Historical Provider, MD  trospium (SANCTURA) 20 MG tablet Take 20  mg by mouth 2 (two) times daily.    Historical Provider, MD   There were no vitals taken for this visit. Physical Exam  Constitutional: She appears well-developed and well-nourished.  HENT:  Head: Normocephalic and atraumatic.  Right Ear: External ear normal.  Left Ear: External ear normal.  Nose: Nose normal.  Eyes: Right eye exhibits no discharge. Left eye exhibits no discharge.  Cardiovascular: Normal rate, regular rhythm and normal heart sounds.   Pulmonary/Chest: Effort normal and breath sounds normal.  Abdominal: Soft. She exhibits no distension. There is no tenderness.  Neurological: She is alert. She is disoriented.  Equal strength in all 4 extremities. Pleasant, alert, oriented to self.   Skin: Skin is warm and dry.  Nursing note and vitals reviewed.   ED Course  Procedures (including  critical care time) Labs Review Labs Reviewed  COMPREHENSIVE METABOLIC PANEL - Abnormal; Notable for the following:    Glucose, Bld 105 (*)    Creatinine, Ser 1.89 (*)    Albumin 3.2 (*)    GFR calc non Af Amer 23 (*)    GFR calc Af Amer 27 (*)    All other components within normal limits  CBC WITH DIFFERENTIAL/PLATELET - Abnormal; Notable for the following:    Monocytes Relative 14 (*)    Monocytes Absolute 1.1 (*)    All other components within normal limits  PROTIME-INR - Abnormal; Notable for the following:    Prothrombin Time 19.2 (*)    INR 1.60 (*)    All other components within normal limits  URINE CULTURE  TROPONIN I  URINALYSIS, ROUTINE W REFLEX MICROSCOPIC  CBC  I-STAT TROPOININ, ED    Imaging Review No results found.   EKG Interpretation   Date/Time:  Monday February 20 2015 16:01:11 EDT Ventricular Rate:  75 PR Interval:    QRS Duration: 109 QT Interval:  387 QTC Calculation: 432 R Axis:   67 Text Interpretation:  Atrial fibrillation ST elevations inferiorly and  V4-6, no reciprocal changes Changes compared to Mar 2015 Confirmed by  Shaila Gilchrest  MD, Kealan Buchan [phone]) on 02/20/2015 4:10:57 PM      CRITICAL CARE Performed by: Pricilla Loveless T   Total critical care time: 30 minutes  Critical care time was exclusive of separately billable procedures and treating other patients.  Critical care was necessary to treat or prevent imminent or life-threatening deterioration.  Critical care was time spent personally by me on the following activities: development of treatment plan with patient and/or surrogate as well as nursing, discussions with consultants, evaluation of patient's response to treatment, examination of patient, obtaining history from patient or surrogate, ordering and performing treatments and interventions, ordering and review of laboratory studies, ordering and review of radiographic studies, pulse oximetry and re-evaluation of patient's condition.   MDM   Final diagnoses:  UTI (lower urinary tract infection)  Acute kidney injury    When family arrived endorse the patient has been weaker and having decreased oral intake for the last 3-4 days. She's been getting dizzy when standing. She has exertional dyspnea after only a few feet, which is new for her. They called EMS because about 10 minutes prior to them calling she had chest pain and was holding her chest. She denies symptoms now and does not seem to recall these. She probably has significant dementia with not being able to remember things same day. Dr. Excell Seltzer present after EKG does show ST elevation that is new from recent EKG and concerning for a  STEMI. While Dr. Excell Seltzerooper was in the room she had about a 5 second asystolic upon. Was unresponsive during this but then came back to normal. Dr. Excell Seltzerooper then had a long coversation with son, who is POA, and after their conversation (I was present for last half) with a decided that it would not be in her best interest to do a heart cath and pacemaker placement given her medical conditions and significant dementia. They have decided to maker her DNR. She does have a UTI, will treat with IV rocephin. Will admit to medicine on telemetry.     Pricilla LovelessScott Macaria Bias, MD 02/20/15 [phone]

## 2015-02-20 NOTE — ED Notes (Signed)
Dr. Goldston at bedside.  

## 2015-02-20 NOTE — ED Notes (Addendum)
Placed patient on bed pan and removed. Collected urine sample.

## 2015-02-20 NOTE — ED Notes (Signed)
EMS - C/o of increased weakness x 2-3 days per family.  Hypotensive on arrival with EMS.  Given of LR in transport.  Denies chest pain.  Increased urinary frequency x 1 week.

## 2015-02-20 NOTE — Progress Notes (Signed)
ANTICOAGULATION CONSULT NOTE - Initial Consult  Pharmacy Consult for Heparin  Indication: chest pain/ACS  Allergies  Allergen Reactions  . Oxybutynin   . Codeine Hives, Itching and Rash    Patient Measurements:   Heparin Dosing Weight: 64 kg   Vital Signs: Temp: 97.7 F (36.5 C) (03/28 1607) Temp Source: Oral (03/28 1607) BP: 101/44 mmHg (03/28 1607) Pulse Rate: 82 (03/28 1607)  Labs:  Recent Labs  02/20/15 1609  HGB 12.7  HCT 39.3  PLT 248  LABPROT 19.2*  INR 1.60*    Estimated Creatinine Clearance: 29.6 mL/min (by C-G formula based on Cr of 1.2).   Medical History: Past Medical History  Diagnosis Date  . Hyperlipidemia   . Thyroid disease     hypothyroidism  . Psoriasis   . Osteopenia   . LBP (low back pain)     mild  . Palpitations     H/O  . Diabetes mellitus     Type II  . Alzheimer's disease     mild  . Arrhythmia 4/12    A-Fib @ [facility]  . Atrial fibrillation   . Stroke   . Hypertension     Medications:   (Not in a hospital admission)  Assessment: [age] YOF with dementia who presented with weakness and some complaint of chest weakness. Pharmacy consulted to start heparin for ACS/STEMI. Patient was on Pradaxa prior to admission. Per patient's family member, her last dose of Pradaxa was at 9 AM today. H/H and Plt wnl. No bleeding noted. SCr elevated at 1.89. CrCl ~ 30 mL/min.   Goal of Therapy:  Heparin level 0.3-0.7 units/ml Monitor platelets by anticoagulation protocol: Yes   Plan:  -Given poor renal fx, start heparin infusion at 750 units/hr at 0900 tomorrow morning (24 hours after previous Pradaxa dose). No bolus -F/u 8 hour HL -Monitor daily HL, CBC and s/s of bleeding  Vinnie LevelBenjamin Felise Georgia, PharmD., BCPS Clinical Pharmacist Pager [phone]

## 2015-02-20 NOTE — ED Notes (Signed)
Per Pharmacy, do not start Heparin gtt.

## 2015-02-20 NOTE — Consult Note (Signed)
CARDIOLOGY CONSULT NOTE  Patient ID: Dawn Bradshaw, MRN: [id number], DOB/AGE: [birth year] 79 y.o. Admit date: 02/20/2015 Date of Consult: 02/20/2015  Primary Physician: Garlan Fillers, MD Primary Cardiologist: Dr Mariah Milling Referring Physician: Dr Criss Alvine  Chief Complaint: Weakness Reason for Consultation: Questionable STEMI  HPI: 81 year-old woman who has underlying dementia presents with chest pain, weakness, and concern for STEMI. She has recently been hospitalized with a UTI and associated progressive weakness. She went to [facility] and has been home for about one week. Has not been eating or drinking x 3 days. Today had chest pain and son called EMS. EKG shows atrial fib which is chronic but also inferior ST elevation. At present the patient denies pain or dyspnea. She has no complaints. Does not know where she is. Family at bedside.   She has had a general decline of late. Has had multiple falls, T2 compression fx last year. Family notes worsening dementia and they are having a very difficult time taking care of her.   During my interview, the patient had a > 6 second pause and lost consciousness transiently. She woke up and was alert as soon as her heart rhythm came back.   Medical History:  Past Medical History  Diagnosis Date  . Hyperlipidemia   . Thyroid disease     hypothyroidism  . Psoriasis   . Osteopenia   . LBP (low back pain)     mild  . Palpitations     H/O  . Diabetes mellitus     Type II  . Alzheimer's disease     mild  . Arrhythmia 4/12    A-Fib @ [facility]  . Atrial fibrillation   . Stroke   . Hypertension       Surgical History:  Past Surgical History  Procedure Laterality Date  . Cleft palate repair    . Myomectomy  1980  . Colonoscopy  1999  . Orif hip fracture  08/2002  . Cataract extraction  08/2008  . Femur im nail Right 10/01/2013    Procedure: INTRAMEDULLARY (IM) NAIL FEMORAL;  Surgeon: Nestor Lewandowsky, MD;  Location: [facility];  Service:  Orthopedics;  Laterality: Right;     Home Meds: Prior to Admission medications   Medication Sig Start Date End Date Taking? Authorizing Provider  amLODipine (NORVASC) 2.5 MG tablet Take 1 tablet (2.5 mg total) by mouth daily. 02/01/14   Jarome Matin, MD  dabigatran (PRADAXA) 150 MG CAPS Take 150 mg by mouth daily.     Historical Provider, MD  donepezil (ARICEPT) 23 MG TABS tablet Take 23 mg by mouth at bedtime.    Historical Provider, MD  galantamine (RAZADYNE) 8 MG tablet Take 8 mg by mouth 2 (two) times daily.    Historical Provider, MD  levothyroxine (SYNTHROID) 25 MCG tablet Take 12.5 mcg by mouth daily.     Historical Provider, MD  LORazepam (ATIVAN) 1 MG tablet Take 1 mg by mouth at bedtime as needed for anxiety.    Historical Provider, MD  losartan-hydrochlorothiazide (HYZAAR) 100-12.5 MG per tablet Take 1 tablet by mouth daily.     Historical Provider, MD  metFORMIN (GLUCOPHAGE) 500 MG tablet Take 500 mg by mouth daily with breakfast.     Historical Provider, MD  omeprazole (PRILOSEC) 20 MG capsule Take 20 mg by mouth daily.    Historical Provider, MD  trospium (SANCTURA) 20 MG tablet Take 20 mg by mouth 2 (two) times daily.    Historical Provider, MD  Inpatient Medications:    . sodium chloride 75 mL/hr at 02/20/15 1836  . cefTRIAXone (ROCEPHIN)  IV 1 g (02/20/15 1835)  . [START ON 02/21/2015] heparin      Allergies:  Allergies  Allergen Reactions  . Codeine Hives, Itching and Rash  . Oxybutynin Other (See Comments)    History   Social History  . Marital Status: Single    Spouse Name: N/A  . Number of Children: N/A  . Years of Education: N/A   Occupational History  . Not on file.   Social History Main Topics  . Smoking status: Former Smoker    Types: Cigarettes    Quit date: 11/25/1948  . Smokeless tobacco: Never Used  . Alcohol Use: No  . Drug Use: No  . Sexual Activity: No   Other Topics Concern  . Not on file   Social History Narrative      Family History  Problem Relation Age of Onset  . Heart disease Brother      Review of Systems: Unable to obtain from patient because of inability to recall sx's  Physical Exam: Blood pressure 118/48, pulse 79, temperature 97.8 F (36.6 C), temperature source Oral, resp. rate 16, SpO2 100 %. Pt is alert but confused, elderly woman in NAD HEENT: normal Neck: JVP normal. Carotid upstrokes normal without bruits. No thyromegaly. Lungs: equal expansion, clear bilaterally CV: Irregular without murmur or gallop Abd: soft, NT, +BS, no bruit, no hepatosplenomegaly Back: no CVA tenderness Ext: no C/C/E Skin: warm and dry without rash Neuro: CNII-XII intact             Strength intact = bilaterally    Labs:  Recent Labs  02/20/15 1609  TROPONINI <0.03   Lab Results  Component Value Date   WBC 8.1 02/20/2015   HGB 12.7 02/20/2015   HCT 39.3 02/20/2015   MCV 86.4 02/20/2015   PLT 248 02/20/2015    Recent Labs Lab 02/20/15 1609  NA 139  K 3.8  CL 102  CO2 25  BUN 23  CREATININE 1.89*  CALCIUM 9.0  PROT 6.4  BILITOT 1.2  ALKPHOS 77  ALT 9  AST 16  GLUCOSE 105*   No results found for: CHOL, HDL, LDLCALC, TRIG No results found for: DDIMER  Radiology/Studies:  Dg Chest Port 1 View  02/20/2015   CLINICAL DATA:  Acute weakness and chest pain for 1 day  EXAM: PORTABLE CHEST - 1 VIEW  COMPARISON:  01/21/2015  FINDINGS: Mild cardiomegaly without associated edema. No focal pneumonia, collapse or consolidation. No effusion or pneumothorax. Defibrillator pads overlie the left chest. Atherosclerosis noted of the ectatic aorta. Monitor leads overlie the chest and abdomen. Degenerative changes of the spine and shoulders. Bones are osteopenic. Prior right rotator cuff surgery. Remote posterior left rib fractures  IMPRESSION: Cardiomegaly Without acute process.   Electronically Signed   By: Judie Petit.  Shick M.D.   On: 02/20/2015 18:29    EKG: Atrial fibrillation with inferolateral ST  elevation consider acute injury  Cardiac Studies: Pending 2D Echo  ASSESSMENT AND PLAN:  1. ACS possible inferolateral STEMI 2. Atrial fibrillation with prolonged asystolic pause 3. Dementia 4. AKI on CKD 3  I've had a lengthy discussion with the patient's family. The patient is pleasant but unable to participate in any decision-making. She is not oriented to place or time. We have reviewed potential treatment options related to her cardiac condition which include cardiac catheterization, PCI, and temporary/permanent pacemaker placement. Considering her advanced age,  acute kidney injury, oral anticoagulant use, and most importantly her significant underlying dementia, we have agreed that most appropriate treatment is conservative medical care with avoidance of invasive procedures. She will be treated with IV heparin without bolus, aspirin, and IV fluids in the setting of kidney dysfunction. All AV nodal blockers should be avoided. The family (sons and daughter-in-law) agree that she should be a DNR if cardiac or respiratory arrest were to occur. Will follow along with her care. thx  Signed, Tonny BollmanMichael Eldrick Penick MD, [facility] HospitalFACC 02/20/2015, 6:47 PM

## 2015-02-20 NOTE — H&P (Signed)
Patient Demographics  Dawn Bradshaw, is a 81 y.o. female  MRN: [id number]   DOB - Sep 06, 1927  Admit Date - 02/20/2015  Outpatient Primary MD for the patient is Garlan Fillers, MD   With History of -  Past Medical History  Diagnosis Date  . Hyperlipidemia   . Thyroid disease     hypothyroidism  . Psoriasis   . Osteopenia   . LBP (low back pain)     mild  . Palpitations     H/O  . Diabetes mellitus     Type II  . Alzheimer's disease     mild  . Arrhythmia 4/12    A-Fib @ [facility]  . Atrial fibrillation   . Stroke   . Hypertension       Past Surgical History  Procedure Laterality Date  . Cleft palate repair    . Myomectomy  1980  . Colonoscopy  1999  . Orif hip fracture  08/2002  . Cataract extraction  08/2008  . Femur im nail Right 10/01/2013    Procedure: INTRAMEDULLARY (IM) NAIL FEMORAL;  Surgeon: Nestor Lewandowsky, MD;  Location: [facility];  Service: Orthopedics;  Laterality: Right;    in for   Chief Complaint  Patient presents with  . Weakness     HPI  Dawn Bradshaw  is a 81 y.o. female, with history of atrial fibrillation on Pradaxa, DM type II, essential hypertension, advanced dementia, CVA, dyslipidemia, hypothyroidism who lives at her home with her son, started complaining of some chest pain and weakness this afternoon,  EMS was called and they noticed ST elevation along with a low blood pressure she was then brought to the ER.  In the ER lateral lead ST elevations were confirmed, she also had evidence of UTI, troponin was unremarkable, chest x-ray was nonacute, blood work showed acute renal insufficiency, she was seen by cardiology, family chose general medical treatment and I was called to admit the patient. Patient is currently pleasantly confused and completely symptom free.    Review  of Systems    In addition to the HPI above,   No Fever-chills, No Headache, No changes with Vision or hearing, No problems swallowing food or Liquids, No Chest pain, Cough or Shortness of Breath, No Abdominal pain, No Nausea or Vommitting, Bowel movements are regular, No Blood in stool or Urine, No dysuria, No new skin rashes or bruises, No new joints pains-aches,  No new weakness, tingling, numbness in any extremity, No recent weight gain or loss, No polyuria, polydypsia or polyphagia, No significant Mental Stressors.  A full 10 point Review of Systems was done, except as stated above, all other Review of Systems were negative.   Social History History  Substance Use Topics  . Smoking status: Former Smoker    Types: Cigarettes    Quit date: 11/25/1948  . Smokeless tobacco: Never Used  . Alcohol Use: No  Family History Family History  Problem Relation Age of Onset  . Heart disease Brother       Prior to Admission medications   Medication Sig Start Date End Date Taking? Authorizing Provider  amLODipine (NORVASC) 2.5 MG tablet Take 1 tablet (2.5 mg total) by mouth Dawn. 02/01/14   Jarome Matinaniel Paterson, MD  dabigatran (PRADAXA) 150 MG CAPS Take 150 mg by mouth Dawn.     Historical Provider, MD  donepezil (ARICEPT) 23 MG TABS tablet Take 23 mg by mouth at bedtime.    Historical Provider, MD  galantamine (RAZADYNE) 8 MG tablet Take 8 mg by mouth 2 (two) times Dawn.    Historical Provider, MD  levothyroxine (SYNTHROID) 25 MCG tablet Take 12.5 mcg by mouth Dawn.     Historical Provider, MD  LORazepam (ATIVAN) 1 MG tablet Take 1 mg by mouth at bedtime as needed for anxiety.    Historical Provider, MD  losartan-hydrochlorothiazide (HYZAAR) 100-12.5 MG per tablet Take 1 tablet by mouth Dawn.     Historical Provider, MD  metFORMIN (GLUCOPHAGE) 500 MG tablet Take 500 mg by mouth Dawn with breakfast.     Historical Provider, MD  omeprazole (PRILOSEC) 20 MG capsule Take 20 mg by  mouth Dawn.    Historical Provider, MD  trospium (SANCTURA) 20 MG tablet Take 20 mg by mouth 2 (two) times Dawn.    Historical Provider, MD    Allergies  Allergen Reactions  . Codeine Hives, Itching and Rash  . Oxybutynin Other (See Comments)    Physical Exam  Vitals  Blood pressure 108/61, pulse 82, temperature 97.7 F (36.5 C), temperature source Oral, resp. rate 21, SpO2 98 %.   1. General elderly white female lying in bed in NAD,    2. Normal affect and insight, Not Suicidal or Homicidal, Awake but confused  3. No F.N deficits, ALL C.Nerves Intact, Strength 5/5 all 4 extremities, Sensation intact all 4 extremities, Plantars down going.  4. Ears and Eyes appear Normal, Conjunctivae clear, PERRLA. Moist Oral Mucosa.  5. Supple Neck, No JVD, No cervical lymphadenopathy appriciated, No Carotid Bruits.  6. Symmetrical Chest wall movement, Good air movement bilaterally, CTAB.  7. RRR, No Gallops, Rubs or Murmurs, No Parasternal Heave.  8. Positive Bowel Sounds, Abdomen Soft, No tenderness, No organomegaly appriciated,No rebound -guarding or rigidity.  9.  No Cyanosis, Normal Skin Turgor, No Skin Rash or Bruise.  10. Good muscle tone,  joints appear normal , no effusions, Normal ROM.  11. No Palpable Lymph Nodes in Neck or Axillae     Data Review  CBC  Recent Labs Lab 02/20/15 1609  WBC 8.1  HGB 12.7  HCT 39.3  PLT 248  MCV 86.4  MCH 27.9  MCHC 32.3  RDW 14.2  LYMPHSABS 1.3  MONOABS 1.1*  EOSABS 0.0  BASOSABS 0.0   ------------------------------------------------------------------------------------------------------------------  Chemistries   Recent Labs Lab 02/20/15 1609  NA 139  K 3.8  CL 102  CO2 25  GLUCOSE 105*  BUN 23  CREATININE 1.89*  CALCIUM 9.0  AST 16  ALT 9  ALKPHOS 77  BILITOT 1.2   ------------------------------------------------------------------------------------------------------------------ estimated creatinine  clearance is 18.8 mL/min (by C-G formula based on Cr of 1.89). ------------------------------------------------------------------------------------------------------------------ No results for input(s): TSH, T4TOTAL, T3FREE, THYROIDAB in the last 72 hours.  Invalid input(s): FREET3   Coagulation profile  Recent Labs Lab 02/20/15 1609  INR 1.60*   ------------------------------------------------------------------------------------------------------------------- No results for input(s): DDIMER in the last 72 hours. -------------------------------------------------------------------------------------------------------------------  Cardiac Enzymes  Recent Labs Lab 02/20/15 1609  TROPONINI <0.03   ------------------------------------------------------------------------------------------------------------------ Invalid input(s): POCBNP   ---------------------------------------------------------------------------------------------------------------  Urinalysis    Component Value Date/Time   COLORURINE ORANGE* 02/20/2015 1702   APPEARANCEUR CLOUDY* 02/20/2015 1702   LABSPEC 1.023 02/20/2015 1702   PHURINE 5.0 02/20/2015 1702   GLUCOSEU NEGATIVE 02/20/2015 1702   HGBUR TRACE* 02/20/2015 1702   BILIRUBINUR SMALL* 02/20/2015 1702   KETONESUR 15* 02/20/2015 1702   PROTEINUR 100* 02/20/2015 1702   UROBILINOGEN 1.0 02/20/2015 1702   NITRITE POSITIVE* 02/20/2015 1702   LEUKOCYTESUR MODERATE* 02/20/2015 1702    ----------------------------------------------------------------------------------------------------------------  Imaging results:   No results found.  My personal review of EKG: Rhythm NSR, ST elevations Lat leads    Assessment & Plan   1. STEMI - lateral lead changes with first troponin being negative, she already has been seen by cardiology, family chose noninvasive gentle medical treatment. Per cardiology aspirin and heparin drip without bolus for now, she had  a questionable sinus pause in the ER while cardiology was present hence no beta blocker has been advised by cardiology. Will check echogram to evaluate wall motion and EF, monitor on telemetry, further recommendations per cardiology.   2. ARF. Likely due to comminution of UTI, dehydration, ACE inhibitor and diuretic being on board. Hold offending medications, gentle hydration, check urine electrolytes, repeat BMP in the morning.   3. DM type II. Check A1c, discontinue Glucophage due to #2 above and age, for now sliding scale insulin and car modified diet.   4. UTI. Place on Rocephin monitor cultures.   5. Hypothyroidism. Continue home dose Synthroid.   6. History of paroxysmal atrial fibrillation. Chads 2 vas score is at least 6. For now oral anticoagulant and is held, she is placed on heparin drip for #1 above per cardiology. Checking echogram.   7. Advanced dementia. Home medications to be continued. She is at risk for delirium. Supportive care. Minimize narcotics and benzodiazepines.     DVT Prophylaxis Heparin    AM Labs Ordered, also please review Full Orders  Family Communication: Admission, patients condition and plan of care including tests being ordered have been discussed with the patient and family who indicate understanding and agree with the plan and Code Status.  Code Status DNR  Likely DC to  home  Condition GUARDED     Time spent in minutes : 35    Jannelle Notaro K M.D on 02/20/2015 at 6:25 PM  Between 7am to 7pm - Pager - [phone]  After 7pm go to [email or web address] - password [facility]  [facility]  Office  [phone]

## 2015-02-20 NOTE — Progress Notes (Signed)
Called urgently to evaluate patient in the emergency department. This is an 81 year old woman with dementia who presented today with weakness. She did have chest pain earlier today and her son called EMS. Her EKG shows changes consistent with an acute inferior wall MI. The patient currently denies any chest pain or shortness of breath. While I was interviewing her, she had a prolonged asystolic episode and lost consciousness for approximately 5 seconds.  Her son is at the bedside. He is her power of attorney. The patient has poor general health with progressive weakness. She has fairly advanced dementia and is unable to remember events of just a few minutes ago. Her dementia has progressed significantly over recent years.  We had a lengthy discussion about treatment options, including invasive cardiac catheterization and PCI, pacemaker placement, and other cardiac supportive measures. Considering her multiple medical problems as well as her dementia, we have elected to treat her conservatively. She will be treated with IV heparin without bolus because of chronic oral anticoagulation. She will also be treated with aspirin. Otherwise care will be focused on her comfort. Will ask that she be admitted to the medicine service with a palliative care consult. We will follow in consultation. Full consultation note to follow.  Tonny BollmanMichael Sherece Gambrill 02/20/2015 4:37 PM

## 2015-02-21 DIAGNOSIS — R531 Weakness: Secondary | ICD-10-CM

## 2015-02-21 DIAGNOSIS — Z515 Encounter for palliative care: Secondary | ICD-10-CM

## 2015-02-21 DIAGNOSIS — E039 Hypothyroidism, unspecified: Secondary | ICD-10-CM

## 2015-02-21 DIAGNOSIS — E785 Hyperlipidemia, unspecified: Secondary | ICD-10-CM

## 2015-02-21 DIAGNOSIS — R079 Chest pain, unspecified: Secondary | ICD-10-CM

## 2015-02-21 DIAGNOSIS — R630 Anorexia: Secondary | ICD-10-CM

## 2015-02-21 DIAGNOSIS — N179 Acute kidney failure, unspecified: Secondary | ICD-10-CM

## 2015-02-21 DIAGNOSIS — R001 Bradycardia, unspecified: Secondary | ICD-10-CM

## 2015-02-21 LAB — GLUCOSE, CAPILLARY
GLUCOSE-CAPILLARY: 94 mg/dL (ref 70–99)
GLUCOSE-CAPILLARY: 134 mg/dL — AB (ref 70–99)
Glucose-Capillary: 85 mg/dL (ref 70–99)
Glucose-Capillary: 81 mg/dL (ref 70–99)

## 2015-02-21 LAB — TROPONIN I: Troponin I: <0.03 ng/mL (ref <0.031)

## 2015-02-21 LAB — BASIC METABOLIC PANEL
Anion gap: 4 — ABNORMAL LOW (ref 5–15)
BUN: 23 mg/dL (ref 6–23)
CALCIUM: 7.9 mg/dL — AB (ref 8.4–10.5)
CHLORIDE: 104 mmol/L (ref 96–112)
CO2: 29 mmol/L (ref 19–32)
CREATININE: 1.49 mg/dL — AB (ref 0.50–1.10)
GFR calc Af Amer: 36 mL/min — ABNORMAL LOW (ref >90)
GFR calc non Af Amer: 31 mL/min — ABNORMAL LOW (ref >90)
Glucose, Bld: 96 mg/dL (ref 70–99)
POTASSIUM: 3.2 mmol/L — AB (ref 3.5–5.1)
Sodium: 137 mmol/L (ref 135–145)

## 2015-02-21 LAB — OSMOLALITY, URINE: OSMOLALITY UR: 202 mosm/kg — AB (ref 390–1090)

## 2015-02-21 LAB — CBC
HCT: 30.8 % — ABNORMAL LOW (ref 36.0–46.0)
Hemoglobin: 10.1 g/dL — ABNORMAL LOW (ref 12.0–15.0)
MCH: 28.4 pg (ref 26.0–34.0)
MCHC: 32.8 g/dL (ref 30.0–36.0)
MCV: 86.5 fL (ref 78.0–100.0)
Platelets: 211 10*3/uL (ref 150–400)
RBC: 3.56 MIL/uL — AB (ref 3.87–5.11)
RDW: 14 % (ref 11.5–15.5)
WBC: 5.2 10*3/uL (ref 4.0–10.5)

## 2015-02-21 LAB — SODIUM, URINE, RANDOM: SODIUM UR: 36 mmol/L

## 2015-02-21 LAB — CREATININE, URINE, RANDOM: CREATININE, URINE: 47.68 mg/dL

## 2015-02-21 LAB — OSMOLALITY: Osmolality: 290 mOsm/kg (ref 275–300)

## 2015-02-21 LAB — TSH: TSH: 0.472 u[IU]/mL (ref 0.350–4.500)

## 2015-02-21 MED ORDER — DABIGATRAN ETEXILATE MESYLATE 75 MG PO CAPS
75.0000 mg | ORAL_CAPSULE | Freq: Two times a day (BID) | ORAL | Status: DC
  Administered 2015-02-21 – 2015-02-23 (×5): 75 mg via ORAL
  Filled 2015-02-21 (×7): qty 1

## 2015-02-21 MED ORDER — SODIUM CHLORIDE 0.9 % IV SOLN
INTRAVENOUS | Status: DC
  Administered 2015-02-21 – 2015-02-22 (×3): via INTRAVENOUS
  Filled 2015-02-21 (×5): qty 1000

## 2015-02-21 MED ORDER — POTASSIUM CHLORIDE CRYS ER 20 MEQ PO TBCR
40.0000 meq | EXTENDED_RELEASE_TABLET | Freq: Once | ORAL | Status: AC
  Administered 2015-02-21: 40 meq via ORAL
  Filled 2015-02-21: qty 2

## 2015-02-21 MED ORDER — GLUCERNA SHAKE PO LIQD
237.0000 mL | Freq: Two times a day (BID) | ORAL | Status: DC
  Administered 2015-02-21 – 2015-02-23 (×4): 237 mL via ORAL

## 2015-02-21 NOTE — Care Management Note (Unsigned)
    Page 1 of 1   02/21/2015     5:09:36 PM CARE MANAGEMENT NOTE 02/21/2015  Patient:  Dawn Bradshaw,Dawn Bradshaw   Account Number:  [id number]  Date Initiated:  02/21/2015  Documentation initiated by:  Dace Denn  Subjective/Objective Assessment:   Pt adm on 02/20/15 with UTI, ARF.  PTA, pt resided at a skilled nursing facility.     Action/Plan:   CSW consulted to facilitate return to SNF when medically stable.  Will follow progress.   Anticipated DC Date:  02/24/2015   Anticipated DC Plan:  SKILLED NURSING FACILITY  In-house referral  Clinical Social Worker      DC Planning Services  CM consult      Choice offered to / List presented to:             Status of service:  In process, will continue to follow Medicare Important Message given?   (If response is "NO", the following Medicare IM given date fields will be blank) Date Medicare IM given:   Medicare IM given by:   Date Additional Medicare IM given:   Additional Medicare IM given by:    Discharge Disposition:    Per UR Regulation:  Reviewed for med. necessity/level of care/duration of stay  If discussed at Long Length of Stay Meetings, dates discussed:    Comments:

## 2015-02-21 NOTE — Progress Notes (Addendum)
Patient Name: Dawn Bradshaw Date of Encounter: 02/21/2015  Principal Problem:   ARF (acute renal failure) Active Problems:   Atrial fibrillation   Mitral regurgitation   Hyperlipidemia   Hypothyroidism   Protein-calorie malnutrition   STEMI (ST elevation myocardial infarction)   UTI (lower urinary tract infection)   Primary Cardiologist: Dr Mariah MillingGollan  Patient Profile: 81 yo female w/ hx history of atrial fibrillation on Pradaxa, DM type II, essential hypertension, dementia, CVA, dyslipidemia, and hypothyroidism who presented yesterday with chest pain, weakness, and concern for STEMI.   SUBJECTIVE: No chest pain or SOB overnight. She still feels very weak.   OBJECTIVE Filed Vitals:   02/20/15 1858 02/20/15 1900 02/20/15 1915 02/21/15 0500  BP:  116/63 112/60 106/61  Pulse:  65 73 64  Temp: 98 F (36.7 C)   97.7 F (36.5 C)  TempSrc:    Oral  Resp:  20 18   Weight:    137 lb 1.6 oz (62.188 kg)  SpO2:  98% 100% 100%   No intake or output data in the 24 hours ending 02/21/15 1013 Filed Weights   02/21/15 0500  Weight: 137 lb 1.6 oz (62.188 kg)    PHYSICAL EXAM General: Well developed, slender, elderly, female in no acute distress. Head: Normocephalic, atraumatic.  Neck: Supple without bruits, no JVD. Lungs:  Resp regular and unlabored, CTA. Heart: Irregular rhythm, no S3, S4, or murmur; no rub. Abdomen: Soft, non-tender, non-distended, BS + x 4.  Extremities: No clubbing, cyanosis, edema.  Neuro: Alert and oriented X 2. Moves all extremities spontaneously. Psych: Normal affect.  LABS: CBC: Recent Labs  02/20/15 1609 02/21/15 0047  WBC 8.1 5.2  NEUTROABS 5.6  --   HGB 12.7 10.1*  HCT 39.3 30.8*  MCV 86.4 86.5  PLT 248 211   INR: Recent Labs  02/20/15 1609  INR 1.60*   Basic Metabolic Panel: Recent Labs  02/20/15 1609 02/21/15 0047  NA 139 137  K 3.8 3.2*  CL 102 104  CO2 25 29  GLUCOSE 105* 96  BUN 23 23  CREATININE 1.89* 1.49*    CALCIUM 9.0 7.9*   Liver Function Tests: Recent Labs  02/20/15 1609  AST 16  ALT 9  ALKPHOS 77  BILITOT 1.2  PROT 6.4  ALBUMIN 3.2*   Cardiac Enzymes: Recent Labs  02/20/15 1957 02/21/15 0047 02/21/15 0745  TROPONINI <0.03 <0.03 <0.03    Recent Labs  02/20/15 1614  TROPIPOC 0.00   TELE: Periods of bradycardia overnight as low as 38 with multiple episodes of < 3 sec pauses. Rate generally well-controlled Currently in A. Fib, 58.        ECG: pending  Radiology/Studies: Dg Chest Port 1 View  02/20/2015   CLINICAL DATA:  Acute weakness and chest pain for 1 day  EXAM: PORTABLE CHEST - 1 VIEW  COMPARISON:  01/21/2015  FINDINGS: Mild cardiomegaly without associated edema. No focal pneumonia, collapse or consolidation. No effusion or pneumothorax. Defibrillator pads overlie the left chest. Atherosclerosis noted of the ectatic aorta. Monitor leads overlie the chest and abdomen. Degenerative changes of the spine and shoulders. Bones are osteopenic. Prior right rotator cuff surgery. Remote posterior left rib fractures  IMPRESSION: Cardiomegaly Without acute process.   Electronically Signed   By: Judie PetitM.  Shick M.D.   On: 02/20/2015 18:29     Current Medications:  . amLODipine  2.5 mg Oral Daily  . aspirin  81 mg Oral Daily  . cefTRIAXone (ROCEPHIN)  IV  1 g Intravenous Q24H  . donepezil  23 mg Oral QHS  . galantamine  8 mg Oral BID  . insulin aspart  0-5 Units Subcutaneous QHS  . insulin aspart  0-9 Units Subcutaneous TID WC  . levothyroxine  12.5 mcg Oral QAC breakfast  . pantoprazole  40 mg Oral Daily  . sodium chloride  3 mL Intravenous Q12H   . sodium chloride 75 mL/hr at 02/21/15 0040  . heparin 750 Units/hr (02/21/15 0843)    ASSESSMENT AND PLAN: Principal Problem:   ARF (acute renal failure) -BUN/Creat: 23/1.49; creatinine improved from yesterday.  -IM managing  Active Problems:    ACS possible inferolateral STEMI  -EKG from 3/28 showing Atrial fibrillation with  subtle inferolateral ST elevation - All Troponin levels: < 0.03    - will recheck ECG to see if changes have resolved - currently asymptomatic - agree with previous plan that medical therapy with no invasive procedures planned is the best option - on ASA, since ez negative, no need for a statin, no BB w/ pauses  Atrial fibrillation -Currently in A. Fib -CHA2DS2VASC Score: 7 - d/c heparin drip, restart Pradaxa -2D Echo pending    Mitral regurgitation - will f/u on echo    Hyperlipidemia - per IM    Hypothyroidism - on home synthroid, will ck TSH - otherwise, per IM    Protein-calorie malnutrition - per IM     UTI (lower urinary tract infection) - per IM  Signed, Theodore Demark , PA-C 10:13 AM 02/21/2015 Patient seen and examined and history reviewed. Agree with above findings and plan. Patient is in no distress and has no complaints. Family is not present. Still in AFib. Some slow rates last pm down to 40. 8.4 second pause noted yesterday afternoon. No pauses over 3 seconds since then. Per Dr. Earmon Phoenix discussion with family will treat conservatively. Will replete potassium and check TSH. Avoid AV nodal blocking drugs. Since no invasive procedures planned will DC heparin and resume Pradaxa. It appears that she did not have a STEMI with normal cardiac enzymes. Will repeat Ecg.  Gary Gabrielsen [place], MDFACC 02/21/2015 11:46 AM

## 2015-02-21 NOTE — Evaluation (Signed)
Physical Therapy Evaluation Patient Details Name: Dawn Bradshaw MRN: [id number] DOB: [birth year] Today's Date: 02/21/2015   History of Present Illness  Dawn Bradshaw is a 81 y.o. female, with history of atrial fibrillation on Pradaxa, DM type II, essential hypertension, advanced dementia, CVA, dyslipidemia, hypothyroidism who lives at her home with her son, started complaining of some chest pain and weakness this afternoon.  Found to have acute renal insufficiency and UTI, EKG shows changes consistent with an acute inferior wall MI.  Clinical Impression  Patient presents with decreased independence with mobility due to deficits listed in PT problem list.  She will benefit from skilled PT in the acute setting to allow return home with family assist or to maximize independence prior to d/c to next venue as if family unable to provide 24 hour physical assist she may need SNF level of care.    Follow Up Recommendations Home health PT;Supervision/Assistance - 24 hour    Equipment Recommendations  Other (comment) (TBA, depends on equipment available at home)    Recommendations for Other Services       Precautions / Restrictions Precautions Precautions: Fall      Mobility  Bed Mobility Overal bed mobility: Needs Assistance Bed Mobility: Supine to Sit;Sit to Supine     Supine to sit: Mod assist Sit to supine: Mod assist   General bed mobility comments: assist for rolling and lifting trunk and to supine for picking up legs and scooting to head of bed  Transfers Overall transfer level: Needs assistance Equipment used: Rolling walker (2 wheeled) Transfers: Stand Pivot Transfers   Stand pivot transfers: Mod assist       General transfer comment: assist to [facility] without walker increased assist needed, then standing wtih walker min assist to pivot and mod cues for safety  Ambulation/Gait                Stairs            Wheelchair Mobility    Modified Rankin (Stroke  Patients Only)       Balance Overall balance assessment: History of Falls;Needs assistance Sitting-balance support: Feet supported Sitting balance-Leahy Scale: Fair Sitting balance - Comments: initially needed support for balance, but once set up with feet supported able to balance with supervision   Standing balance support: Bilateral upper extremity supported Standing balance-Leahy Scale: Poor Standing balance comment: crouched for low COG, and needs assist for balance standing with walker                             Pertinent Vitals/Pain Pain Assessment: Faces Faces Pain Scale: Hurts even more Pain Location: back, generalized with mobility Pain Intervention(s): Monitored during session;Repositioned;Relaxation    Home Living Family/patient expects to be discharged to:: Private residence Living Arrangements: Children Available Help at Discharge: Family Type of Home: House       Home Layout: One level Home Equipment: Environmental consultant - 2 wheels;Cane - single point;Bedside commode Additional Comments: Information from chart; pt is inaccurate historian    Prior Function Level of Independence: Needs assistance   Gait / Transfers Assistance Needed: unknown how much assist family provides, pt states she is independent and helps with housekeeping           Hand Dominance        Extremity/Trunk Assessment   Upper Extremity Assessment: Generalized weakness           Lower Extremity Assessment: Generalized weakness  Cervical / Trunk Assessment: Kyphotic  Communication   Communication: No difficulties  Cognition Arousal/Alertness: Awake/alert Behavior During Therapy: WFL for tasks assessed/performed Overall Cognitive Status: No family/caregiver present to determine baseline cognitive functioning                      General Comments      Exercises        Assessment/Plan    PT Assessment Patient needs continued PT services  PT  Diagnosis Difficulty walking;Generalized weakness;Acute pain   PT Problem List Decreased strength;Decreased mobility;Decreased knowledge of precautions;Decreased activity tolerance;Decreased cognition  PT Treatment Interventions DME instruction;Therapeutic exercise;Gait training;Balance training;Functional mobility training;Patient/family education;Therapeutic activities   PT Goals (Current goals can be found in the Care Plan section) Acute Rehab PT Goals PT Goal Formulation: Patient unable to participate in goal setting Time For Goal Achievement: 03/07/15 Potential to Achieve Goals: Fair    Frequency Min 3X/week   Barriers to discharge        Co-evaluation               End of Session Equipment Utilized During Treatment: Gait belt Activity Tolerance: Patient limited by fatigue Patient left: in bed;with call bell/phone within reach;with bed alarm set      Functional Assessment Tool Used: Clinical Judgement Functional Limitation: Mobility: Walking and moving around Mobility: Walking and Moving Around Current Status [phone](G8978): At least 40 percent but less than 60 percent impaired, limited or restricted Mobility: Walking and Moving Around Goal Status [phone](G8979): At least 20 percent but less than 40 percent impaired, limited or restricted    Time: 0937-1002 PT Time Calculation (min) (ACUTE ONLY): 25 min   Charges:   PT Evaluation $Initial PT Evaluation Tier I: 1 Procedure PT Treatments $Therapeutic Activity: 8-22 mins   PT G Codes:   PT G-Codes **NOT FOR INPATIENT CLASS** Functional Assessment Tool Used: Clinical Judgement Functional Limitation: Mobility: Walking and moving around Mobility: Walking and Moving Around Current Status ([id number]): At least 40 percent but less than 60 percent impaired, limited or restricted Mobility: Walking and Moving Around Goal Status [phone]([id number]): At least 20 percent but less than 40 percent impaired, limited or restricted    Driscoll Children'S HospitalWYNN,CYNDI 02/21/2015,  3:01 PM  Crandonyndi Curley [place], [place] CarolinaPT [phone] 02/21/2015

## 2015-02-21 NOTE — Progress Notes (Signed)
Utilization Review Completed.Dorcas CarrowDowell, Ivanell Deshotel T3/29/2016

## 2015-02-21 NOTE — Progress Notes (Signed)
[facility] PROGRESS NOTE  Dawn Bradshaw AVW:[id number] DOB: 1927-10-09 DOA: 02/20/2015 PCP: Garlan Fillers, MD  Assessment/Plan: 1. ? STEMI- patient has normal cardiac enzymes 3, EKG showed infralateral ST elevation. Heparin discontinued per cardiology, no invasive procedures planned. 2. UTI- patient with abnormal UA, started on IV Rocephin. Urine culture growing gram-negative rods, follow the culture results. 3. Diabetes mellitus- blood glucose is well controlled, continue sliding scale insulin. 4. Hypothyroidism- continue Synthroid 5. History of proximal A. Fib- continue anti-coagulation  with Pradaxa. Heart rate is controlled 6. Hypokalemia- replace potassium, check BMP in a.m. 7. Dementia- no Beaver disturbance, continue Aricept and galantamine.  Code Status: DO NOT RESUSCITATE Family Communication: *No family at bedside Disposition Plan: Home when stable   Consultants:  Cardiology  Procedures:  *None  Antibiotics:  None  HPI/Subjective: 81 y.o. female, with history of atrial fibrillation on Pradaxa, DM type II, essential hypertension, advanced dementia, CVA, dyslipidemia, hypothyroidism who lives at her home with her son, started complaining of some chest pain and weakness this afternoon, EMS was called and they noticed ST elevation along with a low blood pressure she was then brought to the ER. In the ER lateral lead ST elevations were confirmed, she also had evidence of UTI, troponin was unremarkable, chest x-ray was nonacute, blood work showed acute renal insufficiency. Patient had negative troponin 3, lethargic this morning.  Objective: Filed Vitals:   02/21/15 0500  BP: 106/61  Pulse: 64  Temp: 97.7 F (36.5 C)  Resp:     Intake/Output Summary (Last 24 hours) at 02/21/15 1555 Last data filed at 02/21/15 1000  Gross per 24 hour  Intake   1155 ml  Output      0 ml  Net   1155 ml   Filed Weights   02/21/15 0500  Weight: 62.188 kg (137 lb 1.6  oz)    Exam:   General:  Appears in no acute distress, mild lethargy  Cardiovascular: S1-S2 is regular  Respiratory: Clear to auscultation bilaterally  Abdomen: Soft, nontender no organomegaly  Musculoskeletal: No edema of the lower extremities   Data Reviewed: Basic Metabolic Panel:  Recent Labs Lab 02/20/15 1609 02/21/15 0047  NA 139 137  K 3.8 3.2*  CL 102 104  CO2 25 29  GLUCOSE 105* 96  BUN 23 23  CREATININE 1.89* 1.49*  CALCIUM 9.0 7.9*   Liver Function Tests:  Recent Labs Lab 02/20/15 1609  AST 16  ALT 9  ALKPHOS 77  BILITOT 1.2  PROT 6.4  ALBUMIN 3.2*   No results for input(s): LIPASE, AMYLASE in the last 168 hours. No results for input(s): AMMONIA in the last 168 hours. CBC:  Recent Labs Lab 02/20/15 1609 02/21/15 0047  WBC 8.1 5.2  NEUTROABS 5.6  --   HGB 12.7 10.1*  HCT 39.3 30.8*  MCV 86.4 86.5  PLT 248 211   Cardiac Enzymes:  Recent Labs Lab 02/20/15 1609 02/20/15 1957 02/21/15 0047 02/21/15 0745  TROPONINI <0.03 <0.03 <0.03 <0.03   BNP (last 3 results) No results for input(s): BNP in the last 8760 hours.  ProBNP (last 3 results) No results for input(s): PROBNP in the last 8760 hours.  CBG:  Recent Labs Lab 02/20/15 2038 02/21/15 0746 02/21/15 1151  GLUCAP 109* 85 81    Recent Results (from the past 240 hour(s))  Urine culture     Status: None (Preliminary result)   Collection Time: 02/20/15  5:02 PM  Result Value Ref Range Status   Specimen  Description URINE, RANDOM  Final   Special Requests NONE  Final   Colony Count   Final    >=100,000 COLONIES/ML Performed at [facility]    Culture   Final    GRAM NEGATIVE RODS Performed at [facility]    Report Status PENDING  Incomplete     Studies: Dg Chest Port 1 View  02/20/2015   CLINICAL DATA:  Acute weakness and chest pain for 1 day  EXAM: PORTABLE CHEST - 1 VIEW  COMPARISON:  01/21/2015  FINDINGS: Mild cardiomegaly without associated  edema. No focal pneumonia, collapse or consolidation. No effusion or pneumothorax. Defibrillator pads overlie the left chest. Atherosclerosis noted of the ectatic aorta. Monitor leads overlie the chest and abdomen. Degenerative changes of the spine and shoulders. Bones are osteopenic. Prior right rotator cuff surgery. Remote posterior left rib fractures  IMPRESSION: Cardiomegaly Without acute process.   Electronically Signed   By: Judie PetitM.  Shick M.D.   On: 02/20/2015 18:29    Scheduled Meds: . amLODipine  2.5 mg Oral Daily  . aspirin  81 mg Oral Daily  . cefTRIAXone (ROCEPHIN)  IV  1 g Intravenous Q24H  . dabigatran  75 mg Oral BID  . donepezil  23 mg Oral QHS  . galantamine  8 mg Oral BID  . insulin aspart  0-5 Units Subcutaneous QHS  . insulin aspart  0-9 Units Subcutaneous TID WC  . levothyroxine  12.5 mcg Oral QAC breakfast  . pantoprazole  40 mg Oral Daily  . sodium chloride  3 mL Intravenous Q12H   Continuous Infusions: . sodium chloride 75 mL/hr at 02/21/15 0040    Principal Problem:   ARF (acute renal failure) Active Problems:   Atrial fibrillation   Mitral regurgitation   Hyperlipidemia   Hypothyroidism   Protein-calorie malnutrition   STEMI (ST elevation myocardial infarction)   UTI (lower urinary tract infection)    Time spent: 25 min    [facility] HospitalAMA,GAGAN S  [facility] Pager [phone] If 7PM-7AM, please contact night-coverage at [email or web address], password [email or web address] 02/21/2015, 3:55 PM  LOS: 1 day

## 2015-02-21 NOTE — Progress Notes (Signed)
INITIAL NUTRITION ASSESSMENT  DOCUMENTATION CODES Per approved criteria  -Not Applicable   INTERVENTION: Provide Glucerna Shake po BID, each supplement provides 220 kcal and 10 grams of protein.  Encourage adequate PO intake.   NUTRITION DIAGNOSIS: Increased nutrient needs related to acute renal failure as evidenced by estimated nutrition needs.   Goal: Pt to meet >/= 90% of their estimated nutrition needs   Monitor:  PO intake, weight trends, labs, I/O's  Reason for Assessment: MST  81 y.o. female  Admitting Dx: ARF (acute renal failure)  ASSESSMENT: Pt with history of atrial fibrillation, DM type II, essential hypertension, advanced dementia, CVA, dyslipidemia, hypothyroidism presents with complaining of some chest pain and weakness. Lateral lead ST elevations were confirmed, she also had evidence of UTI. Blood work showed acute renal insufficiency.  Pt reports having a good appetite currently and PTA at home eating at least 3 meals daily along with consuming a nutritional shake 1-2/day. Pt reports usual body weight of 150 lbs. Noted pt with a 10.7% weight loss in 7 months, however is not found significant. No current percent meal completion recorded, however pt reports she has been consuming most of her meals. Pt is agreeable to Glucerna shake. RD to order. Pt was encouraged to eat her food at meals and to drink her supplements.   Nutrition Focused Physical Exam:  Subcutaneous Fat:  Orbital Region: N/A Upper Arm Region: Mild to moderate depletion Thoracic and Lumbar Region: N/A  Muscle:  Temple Region: N/A Clavicle Bone Region: Mild to moderate depletion Clavicle and Acromion Bone Region: Mild to moderate depletion Scapular Bone Region: N/A Dorsal Hand: N/A Patellar Region: WNL Anterior Thigh Region: WNL Posterior Calf Region: WNL  Edema: none  Fat and muscle mass depletion may be associated with the natural aging process.  Labs: Low potassium, calcium, and  GFR. High creatinine.  Height: Ht Readings from Last 1 Encounters:  02/08/15 5\' 4"  (1.626 m)    Weight: Wt Readings from Last 1 Encounters:  02/21/15 137 lb 1.6 oz (62.188 kg)    Ideal Body Weight: 120 lbs  % Ideal Body Weight: 114%  Wt Readings from Last 10 Encounters:  02/21/15 137 lb 1.6 oz (62.188 kg)  02/08/15 140 lb 3.2 oz (63.594 kg)  01/25/15 134 lb 11.2 oz (61.1 kg)  06/30/14 153 lb (69.4 kg)  06/13/14 149 lb (67.586 kg)  05/16/14 145 lb (65.772 kg)  01/28/14 154 lb (69.854 kg)  10/01/13 170 lb 3.1 oz (77.2 kg)  03/22/11 146 lb 6.4 oz (66.407 kg)    Usual Body Weight: 150 lbs  % Usual Body Weight: 91%  BMI:  Body mass index is 23.52 kg/(m^2).  Estimated Nutritional Needs: Kcal: 1600-1850 Protein: 80-90 grams Fluid: Per MD  Skin: Intact  Diet Order: Diet heart healthy/carb modified Room service appropriate?: Yes; Fluid consistency:: Thin  EDUCATION NEEDS: -No education needs identified at this time  No intake or output data in the 24 hours ending 02/21/15 1007  Last BM: PTA  Labs:   Recent Labs Lab 02/20/15 1609 02/21/15 0047  NA 139 137  K 3.8 3.2*  CL 102 104  CO2 25 29  BUN 23 23  CREATININE 1.89* 1.49*  CALCIUM 9.0 7.9*  GLUCOSE 105* 96    CBG (last 3)   Recent Labs  02/20/15 2038 02/21/15 0746  GLUCAP 109* 85    Scheduled Meds: . amLODipine  2.5 mg Oral Daily  . aspirin  81 mg Oral Daily  . cefTRIAXone (ROCEPHIN)  IV  1 g Intravenous Q24H  . donepezil  23 mg Oral QHS  . galantamine  8 mg Oral BID  . insulin aspart  0-5 Units Subcutaneous QHS  . insulin aspart  0-9 Units Subcutaneous TID WC  . levothyroxine  12.5 mcg Oral QAC breakfast  . pantoprazole  40 mg Oral Daily  . sodium chloride  3 mL Intravenous Q12H    Continuous Infusions: . sodium chloride 75 mL/hr at 02/21/15 0040  . heparin 750 Units/hr (02/21/15 0843)    Past Medical History  Diagnosis Date  . Hyperlipidemia   . Thyroid disease      hypothyroidism  . Psoriasis   . Osteopenia   . LBP (low back pain)     mild  . Palpitations     H/O  . Diabetes mellitus     Type II  . Alzheimer's disease     mild  . Arrhythmia 4/12    A-Fib @ [facility]  . Atrial fibrillation   . Stroke   . Hypertension     Past Surgical History  Procedure Laterality Date  . Cleft palate repair    . Myomectomy  1980  . Colonoscopy  1999  . Orif hip fracture  08/2002  . Cataract extraction  08/2008  . Femur im nail Right 10/01/2013    Procedure: INTRAMEDULLARY (IM) NAIL FEMORAL;  Surgeon: Nestor Lewandowsky, MD;  Location: [facility];  Service: Orthopedics;  Laterality: Right;    Marijean Niemann, MS, RD, LDN Pager # [phone] After hours/ weekend pager # [phone]

## 2015-02-22 DIAGNOSIS — E46 Unspecified protein-calorie malnutrition: Secondary | ICD-10-CM

## 2015-02-22 DIAGNOSIS — I480 Paroxysmal atrial fibrillation: Secondary | ICD-10-CM

## 2015-02-22 DIAGNOSIS — N170 Acute kidney failure with tubular necrosis: Secondary | ICD-10-CM

## 2015-02-22 LAB — BASIC METABOLIC PANEL
Anion gap: 5 (ref 5–15)
BUN: 19 mg/dL (ref 6–23)
CALCIUM: 8.3 mg/dL — AB (ref 8.4–10.5)
CHLORIDE: 111 mmol/L (ref 96–112)
CO2: 26 mmol/L (ref 19–32)
Creatinine, Ser: 1.27 mg/dL — ABNORMAL HIGH (ref 0.50–1.10)
GFR, EST AFRICAN AMERICAN: 43 mL/min — AB (ref >90)
GFR, EST NON AFRICAN AMERICAN: 37 mL/min — AB (ref >90)
Glucose, Bld: 119 mg/dL — ABNORMAL HIGH (ref 70–99)
Potassium: 4 mmol/L (ref 3.5–5.1)
SODIUM: 142 mmol/L (ref 135–145)

## 2015-02-22 LAB — GLUCOSE, CAPILLARY
GLUCOSE-CAPILLARY: 105 mg/dL — AB (ref 70–99)
Glucose-Capillary: 93 mg/dL (ref 70–99)
Glucose-Capillary: 124 mg/dL — ABNORMAL HIGH (ref 70–99)
Glucose-Capillary: 102 mg/dL — ABNORMAL HIGH (ref 70–99)

## 2015-02-22 LAB — CBC
HEMATOCRIT: 32.5 % — AB (ref 36.0–46.0)
HEMOGLOBIN: 10.4 g/dL — AB (ref 12.0–15.0)
MCH: 28 pg (ref 26.0–34.0)
MCHC: 32 g/dL (ref 30.0–36.0)
MCV: 87.4 fL (ref 78.0–100.0)
Platelets: 231 10*3/uL (ref 150–400)
RBC: 3.72 MIL/uL — ABNORMAL LOW (ref 3.87–5.11)
RDW: 14.6 % (ref 11.5–15.5)
WBC: 4.7 10*3/uL (ref 4.0–10.5)

## 2015-02-22 LAB — URINE CULTURE

## 2015-02-22 LAB — HEMOGLOBIN A1C
Hgb A1c MFr Bld: 6.4 % — ABNORMAL HIGH (ref 4.8–5.6)
Mean Plasma Glucose: 137 mg/dL

## 2015-02-22 MED ORDER — SULFAMETHOXAZOLE-TRIMETHOPRIM 800-160 MG PO TABS
1.0000 | ORAL_TABLET | Freq: Two times a day (BID) | ORAL | Status: DC
  Administered 2015-02-22 – 2015-02-23 (×2): 1 via ORAL
  Filled 2015-02-22 (×3): qty 1

## 2015-02-22 NOTE — Progress Notes (Signed)
Patient Name: Dawn Bradshaw Date of Encounter: 02/22/2015     Principal Problem:   ARF (acute renal failure) Active Problems:   Atrial fibrillation   Mitral regurgitation   Hyperlipidemia   Hypothyroidism   Protein-calorie malnutrition   STEMI (ST elevation myocardial infarction)   UTI (lower urinary tract infection)   Acute kidney injury    SUBJECTIVE  Patient is in no distress and has no complaints. Family is not present.  CURRENT MEDS . amLODipine  2.5 mg Oral Daily  . aspirin  81 mg Oral Daily  . cefTRIAXone (ROCEPHIN)  IV  1 g Intravenous Q24H  . dabigatran  75 mg Oral BID  . donepezil  23 mg Oral QHS  . feeding supplement (GLUCERNA SHAKE)  237 mL Oral BID BM  . galantamine  8 mg Oral BID  . insulin aspart  0-5 Units Subcutaneous QHS  . insulin aspart  0-9 Units Subcutaneous TID WC  . levothyroxine  12.5 mcg Oral QAC breakfast  . pantoprazole  40 mg Oral Daily  . sodium chloride  3 mL Intravenous Q12H    OBJECTIVE  Filed Vitals:   02/20/15 1915 02/21/15 0500 02/21/15 2127 02/22/15 0637  BP: 112/60 106/61 107/58 121/64  Pulse: 73 64 81 90  Temp:  97.7 F (36.5 C) 98.7 F (37.1 C) 98.8 F (37.1 C)  TempSrc:  Oral Oral Oral  Resp: Weight:  137 lb 1.6 oz (62.188 kg)  138 lb 3.7 oz (62.7 kg)  SpO2: 100% 100% 98% 97%    Intake/Output Summary (Last 24 hours) at 02/22/15 0855 Last data filed at 02/22/15 0659  Gross per 24 hour  Intake   1475 ml  Output      3 ml  Net   1472 ml   Filed Weights   02/21/15 0500 02/22/15 0637  Weight: 137 lb 1.6 oz (62.188 kg) 138 lb 3.7 oz (62.7 kg)    PHYSICAL EXAM  General: Pleasant, NAD. Neuro: Alert and oriented X 3. Moves all extremities spontaneously. Psych: Normal affect. HEENT:  Normal  Neck: Supple without bruits or JVD. Lungs:  Resp regular and unlabored, CTA. Heart: RRR no s3, s4, or murmurs. Abdomen: Soft, non-tender, non-distended, BS + x 4.  Extremities: No clubbing, cyanosis or edema.  DP/PT/Radials 2+ and equal bilaterally.  Accessory Clinical Findings  CBC  Recent Labs  02/20/15 1609 02/21/15 0047 02/22/15 0257  WBC 8.1 5.2 4.7  NEUTROABS 5.6  --   --   HGB 12.7 10.1* 10.4*  HCT 39.3 30.8* 32.5*  MCV 86.4 86.5 87.4  PLT 248 211 231   Basic Metabolic Panel  Recent Labs  02/21/15 0047 02/22/15 0257  NA 137 142  K 3.2* 4.0  CL 104 111  CO2 29 26  GLUCOSE 96 119*  BUN 23 19  CREATININE 1.49* 1.27*  CALCIUM 7.9* 8.3*   Liver Function Tests  Recent Labs  02/20/15 1609  AST 16  ALT 9  ALKPHOS 77  BILITOT 1.2  PROT 6.4  ALBUMIN 3.2*   No results for input(s): LIPASE, AMYLASE in the last 72 hours. Cardiac Enzymes  Recent Labs  02/20/15 1957 02/21/15 0047 02/21/15 0745  TROPONINI <0.03 <0.03 <0.03   BNP Invalid input(s): POCBNP D-Dimer No results for input(s): DDIMER in the last 72 hours. Hemoglobin A1C  Recent Labs  02/20/15 2023  HGBA1C 6.4*   Fasting Lipid Panel No results for input(s): CHOL, HDL, LDLCALC, TRIG, CHOLHDL, LDLDIRECT in the  last 72 hours. Thyroid Function Tests  Recent Labs  02/21/15 1130  TSH 0.472    TELE  Atrial fibrillation with PVCs and frequent pauses. (longest ~2.3)  Radiology/Studies  Dg Chest Port 1 View  02/20/2015   CLINICAL DATA:  Acute weakness and chest pain for 1 day  EXAM: PORTABLE CHEST - 1 VIEW  COMPARISON:  01/21/2015  FINDINGS: Mild cardiomegaly without associated edema. No focal pneumonia, collapse or consolidation. No effusion or pneumothorax. Defibrillator pads overlie the left chest. Atherosclerosis noted of the ectatic aorta. Monitor leads overlie the chest and abdomen. Degenerative changes of the spine and shoulders. Bones are osteopenic. Prior right rotator cuff surgery. Remote posterior left rib fractures  IMPRESSION: Cardiomegaly Without acute process.   Electronically Signed   By: Judie Petit.  Shick M.D.   On: 02/20/2015 18:29   2D ECHO Study Date: 02/21/2015 LV EF: 60% -    65% ------------------------------------------------------------------- Study Conclusions - Left ventricle: The cavity size was normal. Wall thickness was   increased in a pattern of moderate LVH. Systolic function was   normal. The estimated ejection fraction was in the range of 60%   to 65%. Wall motion was normal; there were no regional wall   motion abnormalities. The study is not technically sufficient to   allow evaluation of LV diastolic function. - Aortic valve: Trileaflet. Sclerosis without stenosis. There was   mild regurgitation. - Mitral valve: Calcified annulus. There was trivial regurgitation. - Left atrium: Moderately dilated at 42 ml/m2. - Inferior vena cava: The vessel was normal in size. The   respirophasic diameter changes were in the normal range (>= 50%),   consistent with normal central venous pressure. Impressions: - LVEF 60-65%, moderate LVH, normal wall motion, cannot assess   diastolic function, aortic valve sclerosis with mild AI, trivial   MR, moderate LAE, normal IVC size.    ASSESSMENT AND PLAN  81 yo female w/ hx history of atrial fibrillation on Pradaxa, DM type II, essential hypertension, dementia, CVA, dyslipidemia, and hypothyroidism who presented yesterday with chest pain, weakness, and concern for STEMI.    ARF  -- Creat: 1.49--> 2.17; creatinine continues to improve   ACS possible inferolateral STEMI  -- EKG from 3/28 showing Atrial fibrillation with subtle inferolateral ST elevation. Repeat ECG with almost complete resolution of STE -- All Troponin levels: < 0.03  -- Currently asymptomatic -- Agree with previous plan that medical therapy with no invasive procedures planned is the best option -- Continue ASA. No BB with pauses   Atrial fibrillation- chronic  -- Currently in A. Fib w/ controlled VR -- CHA2DS2VASC Score: heparin discontinue and now back on Pradaxa -- 2D Echo 02/21/15 w/ LVEF 60-65%, moderate LVH, normal wall motion,  cannot assess  diastolic function, aortic valve sclerosis with mild AI, trivial MR, moderate LAE, normal IVC size. -- Some slow rates during night down to 40. 8.4 second pause noted 02/20/15 afternoon. No pauses over 3 seconds since then. Avoid AV nodal blocking agents  Mitral regurgitation -- Trivial on 2D ECHO yesterday  Hypothyroidism -- On home synthroid, TSH WNL  UTI (lower urinary tract infection) -- Per IM  Hypokalemia- repleted  Billy Fischer PA-C  Pager [phone]  Patient seen and examined and history reviewed. Agree with above findings and plan. Still in afib with controlled response in 70s. No pause more than 3 seconds. Potassium now normal. TSH is normal. Now back on Pradaxa. I would not give ASA with Pradaxa due to bleeding risk.  Per palliative care comfort care is desired. No further cardiac intervention planned. Will sign off.  Royanne Warshaw [place], MDFACC 02/22/2015 11:35 AM

## 2015-02-22 NOTE — Progress Notes (Signed)
[facility] PROGRESS NOTE  Dawn Bradshaw ZOX:[id number] DOB: 03-13-1927 DOA: 02/20/2015 PCP: Garlan Fillers, MD  Assessment/Plan: 1. ? STEMI- patient has normal cardiac enzymes 3, EKG showed infralateral ST elevation. Heparin discontinued per cardiology, no invasive procedures planned.  It seems like the family have decided on palliative/comfort care 2. Klebsiella pneumoniae pyelonephritis- patient with abnormal UA, started on IV Rocephin.  This urine is pan-sensitive-narrowed to by mouth Bactrim to complete 2 more days 3. Diabetes mellitus- blood glucose is well controlled, continue sliding scale insulin--93-->105 4. Hypothyroidism- continue Synthroid, TSH 0.472 5. History of proximal A. Fib- continue anti-coagulation  with Pradaxa. Heart rate is controlled 6. Hypokalemia- replace potassium, check BMP in a.m. 7. Dementia- no Beaver disturbance, continue Aricept and galantamine.  Code Status: DO NOT RESUSCITATE Family Communication: *No family at bedside--I did call and leave a message with the son Georgiann Cocker who is [facility] POA Disposition Plan: Unclear, potentially skilled nursing facility with palliative following?   Consultants:  Cardiology  Procedures:  *None  Antibiotics:  None  HPI/Subjective: 81 y.o. female, with history of atrial fibrillation on Pradaxa, DM type II, essential hypertension, advanced dementia, CVA, dyslipidemia, hypothyroidism who lives at her home with her son, started complaining of some chest pain and weakness this afternoon, EMS was called and they noticed ST elevation along with a low blood pressure she was then brought to the ER. In the ER lateral lead ST elevations were confirmed, she also had evidence of UTI, troponin was unremarkable, chest x-ray was nonacute, blood work showed acute renal insufficiency. Patient had negative troponin 3,    Sleepy this a.m. Arousable Knows that she is at [facility] No chest pain no other  spontaneous complaint Tolerating Ensure drinks  Objective: Filed Vitals:   02/22/15 1315  BP: 131/71  Pulse: 83  Temp: 98 F (36.7 C)  Resp: 16    Intake/Output Summary (Last 24 hours) at 02/22/15 1643 Last data filed at 02/22/15 1400  Gross per 24 hour  Intake   1490 ml  Output      1 ml  Net   1489 ml   Filed Weights   02/21/15 0500 02/22/15 0637  Weight: 62.188 kg (137 lb 1.6 oz) 62.7 kg (138 lb 3.7 oz)    Exam:   General:  Appears in no acute distress, sleepy but arousable, bitemporal wasting, no skin tenting  Cardiovascular: S1-S2 is regular  Respiratory: Clear to auscultation bilaterally  Abdomen: Soft, nontender no organomegaly  Musculoskeletal: Trace lower extremity edema  Data Reviewed: Basic Metabolic Panel:  Recent Labs Lab 02/20/15 1609 02/21/15 0047 02/22/15 0257  NA 139 137 142  K 3.8 3.2* 4.0  CL 102 104 111  CO2 GLUCOSE 105* 96 119*  BUN CREATININE 1.89* 1.49* 1.27*  CALCIUM 9.0 7.9* 8.3*   Liver Function Tests:  Recent Labs Lab 02/20/15 1609  AST 16  ALT 9  ALKPHOS 77  BILITOT 1.2  PROT 6.4  ALBUMIN 3.2*   No results for input(s): LIPASE, AMYLASE in the last 168 hours. No results for input(s): AMMONIA in the last 168 hours. CBC:  Recent Labs Lab 02/20/15 1609 02/21/15 0047 02/22/15 0257  WBC 8.1 5.2 4.7  NEUTROABS 5.6  --   --   HGB 12.7 10.1* 10.4*  HCT 39.3 30.8* 32.5*  MCV 86.4 86.5 87.4  PLT 248 211 231   Cardiac Enzymes:  Recent Labs Lab 02/20/15 1609 02/20/15 1957 02/21/15 0047 02/21/15  0745  TROPONINI <0.03 <0.03 <0.03 <0.03   BNP (last 3 results) No results for input(s): BNP in the last 8760 hours.  ProBNP (last 3 results) No results for input(s): PROBNP in the last 8760 hours.  CBG:  Recent Labs Lab 02/21/15 1151 02/21/15 1650 02/21/15 2152 02/22/15 0746 02/22/15 1123  GLUCAP 81 94 134* 93 124*    Recent Results (from the past 240 hour(s))  Urine culture      Status: None   Collection Time: 02/20/15  5:02 PM  Result Value Ref Range Status   Specimen Description URINE, RANDOM  Final   Special Requests NONE  Final   Colony Count   Final    >=100,000 COLONIES/ML Performed at [facility]    Culture   Final    KLEBSIELLA PNEUMONIAE Performed at [facility]    Report Status 02/22/2015 FINAL  Final   Organism ID, Bacteria KLEBSIELLA PNEUMONIAE  Final      Susceptibility   Klebsiella pneumoniae - MIC*    AMPICILLIN RESISTANT      CEFAZOLIN <=4 SENSITIVE Sensitive     CEFTRIAXONE <=1 SENSITIVE Sensitive     CIPROFLOXACIN <=0.25 SENSITIVE Sensitive     GENTAMICIN <=1 SENSITIVE Sensitive     LEVOFLOXACIN <=0.12 SENSITIVE Sensitive     NITROFURANTOIN 32 SENSITIVE Sensitive     TOBRAMYCIN <=1 SENSITIVE Sensitive     TRIMETH/SULFA <=20 SENSITIVE Sensitive     PIP/TAZO <=4 SENSITIVE Sensitive     * KLEBSIELLA PNEUMONIAE     Studies: Dg Chest Port 1 View  02/20/2015   CLINICAL DATA:  Acute weakness and chest pain for 1 day  EXAM: PORTABLE CHEST - 1 VIEW  COMPARISON:  01/21/2015  FINDINGS: Mild cardiomegaly without associated edema. No focal pneumonia, collapse or consolidation. No effusion or pneumothorax. Defibrillator pads overlie the left chest. Atherosclerosis noted of the ectatic aorta. Monitor leads overlie the chest and abdomen. Degenerative changes of the spine and shoulders. Bones are osteopenic. Prior right rotator cuff surgery. Remote posterior left rib fractures  IMPRESSION: Cardiomegaly Without acute process.   Electronically Signed   By: Judie PetitM.  Shick M.D.   On: 02/20/2015 18:29    Scheduled Meds: . dabigatran  75 mg Oral BID  . donepezil  23 mg Oral QHS  . feeding supplement (GLUCERNA SHAKE)  237 mL Oral BID BM  . galantamine  8 mg Oral BID  . levothyroxine  12.5 mcg Oral QAC breakfast  . pantoprazole  40 mg Oral Daily  . sodium chloride  3 mL Intravenous Q12H   Continuous Infusions: . sodium chloride 0.9 % 1,000  mL with potassium chloride 20 mEq infusion 75 mL/hr at 02/22/15 [id number]    Principal Problem:   ARF (acute renal failure) Active Problems:   Atrial fibrillation   Mitral regurgitation   Hyperlipidemia   Hypothyroidism   Protein-calorie malnutrition   STEMI (ST elevation myocardial infarction)   UTI (lower urinary tract infection)   Acute kidney injury   Palliative care encounter   Decreased appetite   Weakness    Time spent: 25 min  Pleas KochJai Lamesha Tibbits, MD [facility] (P) [phone]

## 2015-02-22 NOTE — Progress Notes (Signed)
CSW (Clinical Child psychotherapistocial Worker) notified pt was potentially admitted from facility. CSW called pt son Arlys JohnBrian but unable to reach. CSW spoke with pt son Nedra HaiLee who informed CSW pt was admitted from home. Pt lives with her two sons and daughter-in-law. Nedra HaiLee confirmed that his brother Judie GrieveBryan is pt HCPOA. CSW updated MD. At this time, pt has no hospital social work needs. CSW signing off.  Esra Frankowski, LCSWA [phone]

## 2015-02-22 NOTE — Consult Note (Signed)
Patient ID:Dawn Bradshaw      DOB: 04/19/1927      MRN:[id number]     Consult Note from the Palliative Medicine Team at [facility]    Consult Requested by: Dr. Singh     PCP: PATERSON,DANIEL G, MD Reason for Consultation: STEMI     Phone Number:[phone]  Assessment of patients Current state: I met today with [name] (h/o dementia and sleeps throughout most of my visit). Her sons Bryan and Lee are at bedside and they are her caregivers. They explain how she has declined over the past few weeks and days with decreased appetite and lethargy/"sleeping all the time." She ambulates with assistance and prior to past few weeks had a good appetite. They want conservative management and would like for comfort to be priority for her. They tell me that they are considering short term rehab for her if she is able to participate and we also discussed the possibility of home with hospice as well - they are open to all options and what is recommended and best for her. They express a desire to keep her comfortable at home but are open to short term rehab if recommended (they may need this for respite). I will continue to follow and support.    Goals of Care: 1.  Code Status: DNR   2. Disposition: To be determined. Likely short term rehab with palliative to follow.    3. Symptom Management:   1. Decreased appetite: They say that she needs to be encouraged and to help wake her up over a few minutes and then she will usually eat - discussed with NT. Consider feeding supplements as needed.  2. Weakness: Continue conservative medical management. PT/OT to follow. Considering short term rehab.   4. Psychosocial: Emotional support provided to patient and her sons at bedside.    Brief HPI: 81 yo female with underlying Alzheimer's dementia presents with chest pain, weakness, and concern for STEMI. She has recently been hospitalized with a UTI and associated progressive weakness. She went to short term SNF  and has been home for ~one week. Has not been eating or drinking x 3 days. Had presumed chest pain when she grabbed her chest and son called EMS. EKG shows atrial fib which is chronic but also inferior ST elevation. Patient denies pain or dyspnea. She has no complaints. Does not know where she is. She has had a general decline of late. Has had multiple falls, T2 compression fx last year. Family notes worsening dementia and they are having a very difficult time taking care of her. In the ED had a witnessed > 6 second pause and lost consciousness transiently. She woke up and was alert as soon as her heart rhythm came back. PMH reviewed below.    ROS: Denies pain, nausea, shortness of breath, chest pain.     PMH:  Past Medical History  Diagnosis Date  . Hyperlipidemia   . Thyroid disease     hypothyroidism  . Psoriasis   . Osteopenia   . LBP (low back pain)     mild  . Palpitations     H/O  . Diabetes mellitus     Type II  . Alzheimer's disease     mild  . Arrhythmia 4/12    A-Fib @ [facility]  . Atrial fibrillation   . Stroke   . Hypertension      PSH: Past Surgical History  Procedure Laterality Date  . Cleft palate repair    .   Myomectomy  1980  . Colonoscopy  1999  . Orif hip fracture  08/2002  . Cataract extraction  08/2008  . Femur im nail Right 10/01/2013    Procedure: INTRAMEDULLARY (IM) NAIL FEMORAL;  Surgeon: Frank J Rowan, MD;  Location: [facility];  Service: Orthopedics;  Laterality: Right;   I have reviewed the FH and SH and  If appropriate update it with new information. Allergies  Allergen Reactions  . Razadyne [Galantamine] Other (See Comments)    Increased confusion  . Codeine Hives, Itching and Rash  . Oxybutynin Other (See Comments)   Scheduled Meds: . amLODipine  2.5 mg Oral Daily  . aspirin  81 mg Oral Daily  . cefTRIAXone (ROCEPHIN)  IV  1 g Intravenous Q24H  . dabigatran  75 mg Oral BID  . donepezil  23 mg Oral QHS  . feeding supplement (GLUCERNA SHAKE)   237 mL Oral BID BM  . galantamine  8 mg Oral BID  . insulin aspart  0-5 Units Subcutaneous QHS  . insulin aspart  0-9 Units Subcutaneous TID WC  . levothyroxine  12.5 mcg Oral QAC breakfast  . pantoprazole  40 mg Oral Daily  . sodium chloride  3 mL Intravenous Q12H   Continuous Infusions: . sodium chloride 0.9 % 1,000 mL with potassium chloride 20 mEq infusion 75 mL/hr at 02/21/15 1732   PRN Meds:.alum & mag hydroxide-simeth, guaiFENesin-dextromethorphan, HYDROcodone-acetaminophen, LORazepam, nitroGLYCERIN, ondansetron **OR** ondansetron (ZOFRAN) IV, polyethylene glycol    BP 121/64 mmHg  Pulse 90  Temp(Src) 98.8 F (37.1 C) (Oral)  Resp 18  Wt 62.7 kg (138 lb 3.7 oz)  SpO2 97%   PPS: 20%   Intake/Output Summary (Last 24 hours) at 02/22/15 0816 Last data filed at 02/22/15 0659  Gross per 24 hour  Intake   1475 ml  Output      3 ml  Net   1472 ml   LBM: 3/29  Physical Exam:  General: NAD, pleasantly confused, elderly, cooperative HEENT: [place]/AT, moist mucous membranes Chest: CTA throughout, no labored breathing, symmetric CVS: RRR, S1 S2 Abdomen: Soft, NT, ND, +BS Ext: MAE, no edema, warm to touch Neuro: Easily arousable, oriented to person only   Labs: CBC    Component Value Date/Time   WBC 4.7 02/22/2015 0257   WBC 5.8 02/02/2015   RBC 3.72* 02/22/2015 0257   HGB 10.4* 02/22/2015 0257   HCT 32.5* 02/22/2015 0257   PLT 231 02/22/2015 0257   MCV 87.4 02/22/2015 0257   MCH 28.0 02/22/2015 0257   MCHC 32.0 02/22/2015 0257   RDW 14.6 02/22/2015 0257   LYMPHSABS 1.3 02/20/2015 1609   MONOABS 1.1* 02/20/2015 1609   EOSABS 0.0 02/20/2015 1609   BASOSABS 0.0 02/20/2015 1609    BMET    Component Value Date/Time   NA 142 02/22/2015 0257   NA 140 02/02/2015   K 4.0 02/22/2015 0257   CL 111 02/22/2015 0257   CO2 26 02/22/2015 0257   GLUCOSE 119* 02/22/2015 0257   BUN 19 02/22/2015 0257   BUN 27* 02/02/2015   CREATININE 1.27* 02/22/2015 0257   CREATININE  1.2* 02/02/2015   CALCIUM 8.3* 02/22/2015 0257   GFRNONAA 37* 02/22/2015 0257   GFRAA 43* 02/22/2015 0257    CMP     Component Value Date/Time   NA 142 02/22/2015 0257   NA 140 02/02/2015   K 4.0 02/22/2015 0257   CL 111 02/22/2015 0257   CO2 26 02/22/2015 0257   GLUCOSE 119* 02/22/2015   0257   BUN 19 02/22/2015 0257   BUN 27* 02/02/2015   CREATININE 1.27* 02/22/2015 0257   CREATININE 1.2* 02/02/2015   CALCIUM 8.3* 02/22/2015 0257   PROT 6.4 02/20/2015 1609   ALBUMIN 3.2* 02/20/2015 1609   AST 16 02/20/2015 1609   ALT 9 02/20/2015 1609   ALKPHOS 77 02/20/2015 1609   BILITOT 1.2 02/20/2015 1609   GFRNONAA 37* 02/22/2015 0257   GFRAA 43* 02/22/2015 0257    Time In Time Out Total Time Spent with Patient Total Overall Time  1400 1500 75mn 637m    Greater than 50%  of this time was spent counseling and coordinating care related to the above assessment and plan.  AlVinie SillNP Palliative Medicine Team Pager # [phone]-F 8a-5p) Team Phone # [phone]/Weekends)

## 2015-02-22 NOTE — Evaluation (Addendum)
Occupational Therapy Evaluation Patient Details Name: ABIGAILE ROSSIE MRN: [id number] DOB: 02-13-1927 Today's Date: 02/22/2015    History of Present Illness Koleen Celia is a 81 y.o. female, with history of atrial fibrillation on Pradaxa, DM type II, essential hypertension, advanced dementia, CVA, dyslipidemia, hypothyroidism who lives at her home with her son, started complaining of some chest pain and weakness this afternoon.  Found to have acute renal insufficiency and UTI, EKG shows changes consistent with an acute inferior wall MI.   Clinical Impression   Pt admitted with above. Unsure of PLOF, as family was not present in session. Feel pt will benefit from acute OT to increase strength and independence. Recommending HHOT and 24/7 assist/supervision at home. If 24/7 assist not available, recommend SNF (OT talked to social worker and she states family reports they can provide 24/7).    Follow Up Recommendations  Home health OT;Supervision/Assistance - 24 hour    Equipment Recommendations  Other (comment) (tbd)    Recommendations for Other Services       Precautions / Restrictions Precautions Precautions: Fall      Mobility Bed Mobility Overal bed mobility: Needs Assistance Bed Mobility: Supine to Sit;Sit to Supine     Supine to sit: Mod assist Sit to supine: Min assist    General bed mobility comments: Cues for technique for supine to sit-assist with trunk and hips. assist and cues to scoot HOB and to adjust.   Transfers Overall transfer level: Needs assistance Equipment used: Rolling walker (2 wheeled) Transfers: Sit to/from [employer] Sit to Stand: Mod assist Stand pivot transfers: Min assist       General transfer comment: cues for technique.    Balance  Min assist for balance while pt standing and performing hygiene.                                          ADL Overall ADL's : Needs assistance/impaired     Grooming:  Wash/dry face;Brushing hair;Sitting;Set up;Supervision/safety Grooming Details (indicate cue type and reason): sitting EOB         Upper Body Dressing : Set up;Supervision/safety;Sitting       Toilet Transfer: Minimal assistance;Stand-pivot;RW;BSC   Toileting- Clothing Manipulation and Hygiene: Moderate assistance;Sit to/from stand       Functional mobility during ADLs: Minimal assistance;Rolling walker (stand pivot) General ADL Comments: Pt limited due to pain. Pt performed grooming sitting EOB and then transferred to [facility] and had BM. Helped pt scoot EOB.     Vision     Perception     Praxis      Pertinent Vitals/Pain Pain Assessment: Faces Faces Pain Scale: Hurts whole lot Pain Location: back Pain Descriptors / Indicators:  ("hurts a whole lot") Pain Intervention(s): Limited activity within patient's tolerance;Monitored during session;Repositioned     Hand Dominance     Extremity/Trunk Assessment Upper Extremity Assessment Upper Extremity Assessment: Generalized weakness   Lower Extremity Assessment Lower Extremity Assessment: Defer to PT evaluation       Communication Communication Communication: No difficulties   Cognition Arousal/Alertness: Awake/alert Behavior During Therapy: WFL for tasks assessed/performed Overall Cognitive Status: History of cognitive impairments - at baseline (no family/caregiver present'')                     General Comments       Exercises       Shoulder  Instructions      Home Living Family/patient expects to be discharged to:: Private residence Living Arrangements: Children Available Help at Discharge: Family Type of Home: House       Home Layout: One level               Home Equipment: Environmental consultantWalker - 2 wheels;Cane - single point;Bedside commode   Additional Comments: Information from chart; pt is inaccurate historian      Prior Functioning/Environment Level of Independence:  (unsure of PLOF)  Gait /  Transfers Assistance Needed: unknown how much assist family provides, pt states she is independent and helps with housekeeping          OT Diagnosis: Generalized weakness;Acute pain   OT Problem List: Decreased strength;Decreased activity tolerance;Impaired balance (sitting and/or standing);Decreased knowledge of use of DME or AE;Decreased knowledge of precautions;Decreased cognition;Pain   OT Treatment/Interventions: Self-care/ADL training;Therapeutic exercise;DME and/or AE instruction;Therapeutic activities;Patient/family education;Balance training    OT Goals(Current goals can be found in the care plan section) Acute Rehab OT Goals Patient Stated Goal: to get up and do more OT Goal Formulation: With patient Time For Goal Achievement: 03/01/15 Potential to Achieve Goals: Good ADL Goals Pt Will Perform Lower Body Bathing: sit to/from stand;with caregiver independent in assisting;with min assist Pt Will Perform Lower Body Dressing: with caregiver independent in assisting;sit to/from stand;with min assist Pt Will Transfer to Toilet: with min guard assist;ambulating Pt Will Perform Toileting - Clothing Manipulation and hygiene: sit to/from stand;with caregiver independent in assisting;with min guard assist  OT Frequency: Min 2X/week   Barriers to D/C:            Co-evaluation              End of Session Equipment Utilized During Treatment: Gait belt;Rolling walker  Activity Tolerance: Patient limited by pain Patient left: in bed;with call bell/phone within reach;with bed alarm set; SCDs reapplied   Time: [id number] OT Time Calculation (min): 12 min Charges:  OT General Charges $OT Visit: 1 Procedure OT Evaluation $Initial OT Evaluation Tier I: 1 Procedure G-CodesEarlie Raveling:    Keliah Harned L OTR/L [phone] 02/22/2015, 12:51 PM

## 2015-02-23 DIAGNOSIS — E032 Hypothyroidism due to medicaments and other exogenous substances: Secondary | ICD-10-CM

## 2015-02-23 LAB — CBC
HCT: 32.4 % — ABNORMAL LOW (ref 36.0–46.0)
Hemoglobin: 10.5 g/dL — ABNORMAL LOW (ref 12.0–15.0)
MCH: 27.9 pg (ref 26.0–34.0)
MCHC: 32.4 g/dL (ref 30.0–36.0)
MCV: 85.9 fL (ref 78.0–100.0)
PLATELETS: 214 10*3/uL (ref 150–400)
RBC: 3.77 MIL/uL — ABNORMAL LOW (ref 3.87–5.11)
RDW: 14.1 % (ref 11.5–15.5)
WBC: 5.9 10*3/uL (ref 4.0–10.5)

## 2015-02-23 LAB — GLUCOSE, CAPILLARY
Glucose-Capillary: 107 mg/dL — ABNORMAL HIGH (ref 70–99)
Glucose-Capillary: 154 mg/dL — ABNORMAL HIGH (ref 70–99)

## 2015-02-23 MED ORDER — SULFAMETHOXAZOLE-TRIMETHOPRIM 800-160 MG PO TABS: 1.0000 | ORAL_TABLET | Freq: Two times a day (BID) | ORAL | Status: AC

## 2015-02-23 MED ORDER — SULFAMETHOXAZOLE-TRIMETHOPRIM 800-160 MG PO TABS: 1.0000 | ORAL_TABLET | Freq: Two times a day (BID) | ORAL | Status: DC

## 2015-02-23 MED ORDER — HYDROCODONE-ACETAMINOPHEN 5-325 MG PO TABS: 1.0000 | ORAL_TABLET | ORAL | Status: AC | PRN

## 2015-02-23 MED ORDER — DOCUSATE SODIUM 100 MG PO CAPS
100.0000 mg | ORAL_CAPSULE | Freq: Every day | ORAL | Status: DC
  Administered 2015-02-23: 100 mg via ORAL
  Filled 2015-02-23: qty 1

## 2015-02-23 MED ORDER — GALANTAMINE HYDROBROMIDE 8 MG PO TABS: 8.0000 mg | ORAL_TABLET | Freq: Two times a day (BID) | ORAL | Status: AC

## 2015-02-23 NOTE — Progress Notes (Signed)
Physical Therapy Treatment Patient Details Name: Dawn Bradshaw MRN: [id number] DOB: June 17, 1927 Today's Date: 02/23/2015    History of Present Illness Dawn Bradshaw is a 81 y.o. female, with history of atrial fibrillation on Pradaxa, DM type II, essential hypertension, advanced dementia, CVA, dyslipidemia, hypothyroidism who lives at her home with her son, started complaining of some chest pain and weakness this afternoon.  Found to have acute renal insufficiency and UTI, EKG shows changes consistent with an acute inferior wall MI.    PT Comments    Progressing slowly.  Needs guidance and support through out.  I suspect she is not far off from her baseline functioning.  Should be easy functionally to assist her at home if her son's choose to go that route.  Follow Up Recommendations  Home health PT;Supervision/Assistance - 24 hour     Equipment Recommendations       Recommendations for Other Services       Precautions / Restrictions Precautions Precautions: Fall    Mobility  Bed Mobility Overal bed mobility: Needs Assistance Bed Mobility: Supine to Sit;Sit to Supine     Supine to sit: Mod assist Sit to supine: Min assist   General bed mobility comments: cues for guidance and truncal assist  Transfers Overall transfer level: Needs assistance Equipment used: Rolling walker (2 wheeled) Transfers: Sit to/from [employer] Sit to Stand: Min assist Stand pivot transfers: Min assist       General transfer comment: cues for technique.  Ambulation/Gait                 Stairs            Wheelchair Mobility    Modified Rankin (Stroke Patients Only)       Balance Overall balance assessment: Needs assistance Sitting-balance support: No upper extremity supported;Single extremity supported Sitting balance-Leahy Scale: Fair Sitting balance - Comments: initially tended to list posteriorly, but as she started scooting herself to EOB she fully  corrected balance.     Standing balance-Leahy Scale: Fair                      Cognition Arousal/Alertness: Awake/alert Behavior During Therapy: WFL for tasks assessed/performed Overall Cognitive Status: History of cognitive impairments - at baseline                      Exercises      General Comments General comments (skin integrity, edema, etc.): lots of rolling scooting and standing for pericare and to remove wet gown to clean p.      Pertinent Vitals/Pain Pain Assessment: Faces Faces Pain Scale: Hurts even more Pain Location: upper back Pain Descriptors / Indicators: Aching;Sore Pain Intervention(s): Monitored during session    Home Living                      Prior Function            PT Goals (current goals can now be found in the care plan section) Acute Rehab PT Goals Patient Stated Goal: to get up and do more PT Goal Formulation: Patient unable to participate in goal setting Time For Goal Achievement: 03/07/15 Potential to Achieve Goals: Fair Progress towards PT goals: Progressing toward goals    Frequency  Min 3X/week    PT Plan Current plan remains appropriate    Co-evaluation             End of Session  Activity Tolerance: Patient limited by fatigue;Patient tolerated treatment well Patient left: in bed;with call bell/phone within reach;with bed alarm set     Time: 1203-1227 PT Time Calculation (min) (ACUTE ONLY): 24 min  Charges:  $Therapeutic Activity: 23-37 mins                    G Codes:      Jaber Dunlow, Eliseo GumKenneth V 02/23/2015, 12:33 PM 02/23/2015  Labette BingKen Ashby Leflore, PT [phone]  (pager)

## 2015-02-23 NOTE — Progress Notes (Signed)
Patient's d/c plan changed from SNF with Palliative care to home with Hospice services- via EMS.  Patient lives with her 2 sons and hospice services were agreed upon.  CSW spoke to son Marshell Levan who confirmed desire to return home.  CSW has spoken multiple times to [place] and if SNF placement had been indicated- patient may not have met criteria for SNF under [employer].  She had been at [facility] in the past.  After patient left the hospital via EMS-  CSW received a call from [place]. She requested notification of patient's d/c plan and stated that there would be follow up in the home.  She was not able to provide information re: the nature of the APS referral.  CSW signing off.  Lorie Phenix. Pauline Good, [place]

## 2015-02-23 NOTE — Progress Notes (Signed)
[facility]  Subjective: [name] is sitting up in bed, smiling, and ate a little bit of her breakfast. No family at bedside. I called and spoke with her son, Dawn Bradshaw, and he is tearful as he tells me that he understands she likely has limited time (< 6 months) and he does not want to take the chance of her dying somewhere other than home. He is very tearful and is dealing with his own health issues - they will greatly benefit from hospice support. She has dementia and ambulates short distance with much assistance, incontinence, recent UTI/pneumonia, and she smiles and will answer simple questions appropriately but unable to hold meaningful conversation. She has now had MI with concerning long pause since admission at risk for acute decompensation. Family wishes for her to be comfortable and to die at home.     Objective: Allergies  Allergen Reactions  . Razadyne [Galantamine] Other (See Comments)    Increased confusion  . Codeine Hives, Itching and Rash  . Oxybutynin Other (See Comments)   Scheduled Meds: . dabigatran  75 mg Oral BID  . donepezil  23 mg Oral QHS  . feeding supplement (GLUCERNA SHAKE)  237 mL Oral BID BM  . galantamine  8 mg Oral BID  . levothyroxine  12.5 mcg Oral QAC breakfast  . pantoprazole  40 mg Oral Daily  . sodium chloride  3 mL Intravenous Q12H  . sulfamethoxazole-trimethoprim  1 tablet Oral Q12H   Continuous Infusions: . sodium chloride 0.9 % 1,000 mL with potassium chloride 20 mEq infusion 75 mL/hr at 02/22/15 2139   PRN Meds:.alum & mag hydroxide-simeth, guaiFENesin-dextromethorphan, HYDROcodone-acetaminophen, LORazepam, nitroGLYCERIN, ondansetron **OR** ondansetron (ZOFRAN) IV, polyethylene glycol  BP 131/71 mmHg  Pulse 93  Temp(Src) 98.6 F (37 C) (Oral)  Resp 16  Wt 62.3 kg (137 lb 5.6 oz)  SpO2 98%   PPS: 20%   Intake/Output Summary (Last 24 hours) at 02/23/15 1250 Last data filed at 02/23/15  0949  Gross per 24 hour  Intake    480 ml  Output    100 ml  Net    380 ml      LBM: 3/29  Physical Exam:  General: NAD, pleasantly confused, elderly, cooperative HEENT: /AT, moist mucous membranes Chest: CTA throughout, no labored breathing, symmetric CVS: RRR, S1 S2 Abdomen: Soft, NT, ND, +BS Ext: MAE, no edema, warm to touch Neuro: Easily arousable, oriented to person only    Labs: CBC    Component Value Date/Time   WBC 5.9 02/23/2015 0524   WBC 5.8 02/02/2015   RBC 3.77* 02/23/2015 0524   HGB 10.5* 02/23/2015 0524   HCT 32.4* 02/23/2015 0524   PLT 214 02/23/2015 0524   MCV 85.9 02/23/2015 0524   MCH 27.9 02/23/2015 0524   MCHC 32.4 02/23/2015 0524   RDW 14.1 02/23/2015 0524   LYMPHSABS 1.3 02/20/2015 1609   MONOABS 1.1* 02/20/2015 1609   EOSABS 0.0 02/20/2015 1609   BASOSABS 0.0 02/20/2015 1609    BMET    Component Value Date/Time   NA 142 02/22/2015 0257   NA 140 02/02/2015   K 4.0 02/22/2015 0257   CL 111 02/22/2015 0257   CO2 26 02/22/2015 0257   GLUCOSE 119* 02/22/2015 0257   BUN 19 02/22/2015 0257   BUN 27* 02/02/2015   CREATININE 1.27* 02/22/2015 0257   CREATININE 1.2* 02/02/2015   CALCIUM 8.3* 02/22/2015 0257   GFRNONAA 37* 02/22/2015 0257  GFRAA 43* 02/22/2015 0257    CMP     Component Value Date/Time   NA 142 02/22/2015 0257   NA 140 02/02/2015   K 4.0 02/22/2015 0257   CL 111 02/22/2015 0257   CO2 26 02/22/2015 0257   GLUCOSE 119* 02/22/2015 0257   BUN 19 02/22/2015 0257   BUN 27* 02/02/2015   CREATININE 1.27* 02/22/2015 0257   CREATININE 1.2* 02/02/2015   CALCIUM 8.3* 02/22/2015 0257   PROT 6.4 02/20/2015 1609   ALBUMIN 3.2* 02/20/2015 1609   AST 16 02/20/2015 1609   ALT 9 02/20/2015 1609   ALKPHOS 77 02/20/2015 1609   BILITOT 1.2 02/20/2015 1609   GFRNONAA 37* 02/22/2015 0257   GFRAA 43* 02/22/2015 0257    Assessment and Plan: 1. Code Status: DNR 2. Symptom Control: 1. Decreased appetite: Slight improvement.  Consider feeding supplements as needed.  2. Weakness: Continue conservative medical management. PT/OT to follow. Family has opted to focus on comfort at home vs rehab.  3. Psycho/Social: Emotional support provided to patient and family via telephone.  4. Disposition: Hopeful for home with hospice.    Time In Time Out Total Time Spent with Patient Total Overall Time  1210 1235     Greater than 50%  of this time was spent counseling and coordinating care related to the above assessment and plan.  Yong Channel, NP Palliative Medicine Team Pager # [phone] (M-F 8a-5p) Team Phone # [phone] (Nights/Weekends)

## 2015-02-23 NOTE — Progress Notes (Signed)
[facility] they will be able to accept the patient for home hospice care; patient will be discharged home via ambulance; Abelino DerrickB Leitha Hyppolite RN,BSN,MHA [phone]

## 2015-02-23 NOTE — Progress Notes (Signed)
Pt sent home via [facility], dc instructions called to son/HCPOA Helene ShoeBryan Oleksy, he verbalized understanding.

## 2015-02-23 NOTE — Discharge Summary (Addendum)
Physician Discharge Summary  Dawn Bradshaw:[id number] DOB: [birth year] DOA: 02/20/2015  PCP: Garlan Fillers, MD  Admit date: 02/20/2015 Discharge date: 02/23/2015  Time spent: 35 minutes  Recommendations for Outpatient Follow-up:  1. Recommend delineation of further goals of care as an outpatient with palliative care-will forward to PMD 2. Recommend no further lab work 3. Complete for completion sake empiric treatment for Klebsiella pneumonia pyelonephritis with Bactrim DS 3 tablets-and dosing would and on 02/25/15 4. I have cut back on various medications including her losartan as well as her HCTZ 5. I had a long discussion with her son on day of discharge and explained that with , acute inferior wall MI on admission she is at high risk for decompensation over the next 1-6 months. It is unlikely she will survive long-term    Discharge Diagnoses:  Principal Problem:   ARF (acute renal failure) Active Problems:   Atrial fibrillation   Mitral regurgitation   Hyperlipidemia   Hypothyroidism   Protein-calorie malnutrition   STEMI (ST elevation myocardial infarction)   UTI (lower urinary tract infection)   Acute kidney injury   Palliative care encounter   Decreased appetite   Weakness   Discharge Condition: Guarded   Diet recommendation: Recommend comfort feeds  Filed Weights   02/21/15 0500 02/22/15 0637 02/23/15 0649  Weight: 62.188 kg (137 lb 1.6 oz) 62.7 kg (138 lb 3.7 oz) 62.3 kg (137 lb 5.6 oz)    History of present illness:  81 year old female history of A. fib, [place] score above 2 on Pradaxa, TY2 DM, HTN, moderate to advanced dementia, prior CVA, hypothyroidism admitted from the emergency room 3/28/6 10 with ST segment elevation MI. Cardiology consult had chest x-ray normal lab work showed acute renal insufficiency. Cardiology and Asian had a long discussion about goals of care on initial evaluation and it was opted that because of her advanced dementia and various  other comorbidities she should be probably comfort care. During hospital stay palliative care also consult on the patient as below  Hospital Course:   1. ? STEMI- patient has normal cardiac enzymes 3, EKG showed infralateral ST elevation. Heparin discontinued per cardiology during this hospital stay, no invasive procedures planned. It seems like the family have decided on palliative/comfort care- home with hospice.  Recommend outpatient goals of care as well as further care delineation dependent on patient outcome as an outpatient 2. Klebsiella pneumoniae pyelonephritis- patient with abnormal UA, started on IV Rocephin. This urine is pan-sensitive-narrowed to by mouth Bactrim to complete 2 more days ending on 4/2 3. Diabetes mellitus- blood glucose is well controlled, continue sliding scale insulin--93-->105 on discharge I discontinued her metformin 4. Hypothyroidism- continue Synthroid, TSH 0.472 5. History of proximal A. Fib- continue anti-coagulation with Pradaxa for prevention of stroke. Heart rate is controlled-would not aggressively control further at this stage 6. Hypokalemia-resolved-no further lab checks 7. Dementia without behavioral disturbance-may, continue Aricept and galantamine.   Discharge Exam: Filed Vitals:   02/23/15 0649  BP: 131/71  Pulse: 93  Temp: 98.6 F (37 C)  Resp: 16    Alert but doesn't really confused. Nonaggressive. No spontaneous complaints. Telemetry discontinued  General: EOMI NCAT Cardiovascular: S1-S2 no murmur rub or gallop Respiratory: Clinically clear  Discharge Instructions   Discharge Instructions    Diet - low sodium heart healthy    Complete by:  As directed      Increase activity slowly    Complete by:  As directed  Current Discharge Medication List    START taking these medications   Details  HYDROcodone-acetaminophen (NORCO/VICODIN) 5-325 MG per tablet Take 1-2 tablets by mouth every 4 (four) hours as needed for  moderate pain. Qty: 30 tablet, Refills: 0    sulfamethoxazole-trimethoprim (BACTRIM DS,SEPTRA DS) 800-160 MG per tablet Take 1 tablet by mouth every 12 (twelve) hours. Qty: 3 tablet, Refills: 0      CONTINUE these medications which have CHANGED   Details  galantamine (RAZADYNE) 8 MG tablet Take 1 tablet (8 mg total) by mouth 2 (two) times daily.      CONTINUE these medications which have NOT CHANGED   Details  dabigatran (PRADAXA) 150 MG CAPS Take 150 mg by mouth daily.     donepezil (ARICEPT) 23 MG TABS tablet Take 23 mg by mouth at bedtime.    levothyroxine (SYNTHROID) 25 MCG tablet Take 12.5 mcg by mouth daily.     LORazepam (ATIVAN) 1 MG tablet Take 1 mg by mouth at bedtime as needed for anxiety.    omeprazole (PRILOSEC) 20 MG capsule Take 20 mg by mouth daily.    trospium (SANCTURA) 20 MG tablet Take 20 mg by mouth 2 (two) times daily.      STOP taking these medications     amLODipine (NORVASC) 2.5 MG tablet      losartan-hydrochlorothiazide (HYZAAR) 100-12.5 MG per tablet      metFORMIN (GLUCOPHAGE) 500 MG tablet        Allergies  Allergen Reactions  . Razadyne [Galantamine] Other (See Comments)    Increased confusion  . Codeine Hives, Itching and Rash  . Oxybutynin Other (See Comments)      The results of significant diagnostics from this hospitalization (including imaging, microbiology, ancillary and laboratory) are listed below for reference.    Significant Diagnostic Studies: Dg Chest Port 1 View  02/20/2015   CLINICAL DATA:  Acute weakness and chest pain for 1 day  EXAM: PORTABLE CHEST - 1 VIEW  COMPARISON:  01/21/2015  FINDINGS: Mild cardiomegaly without associated edema. No focal pneumonia, collapse or consolidation. No effusion or pneumothorax. Defibrillator pads overlie the left chest. Atherosclerosis noted of the ectatic aorta. Monitor leads overlie the chest and abdomen. Degenerative changes of the spine and shoulders. Bones are osteopenic. Prior  right rotator cuff surgery. Remote posterior left rib fractures  IMPRESSION: Cardiomegaly Without acute process.   Electronically Signed   By: Judie PetitM.  Shick M.D.   On: 02/20/2015 18:29    Microbiology: Recent Results (from the past 240 hour(s))  Urine culture     Status: None   Collection Time: 02/20/15  5:02 PM  Result Value Ref Range Status   Specimen Description URINE, RANDOM  Final   Special Requests NONE  Final   Colony Count   Final    >=100,000 COLONIES/ML Performed at [facility]    Culture   Final    KLEBSIELLA PNEUMONIAE Performed at [facility]    Report Status 02/22/2015 FINAL  Final   Organism ID, Bacteria KLEBSIELLA PNEUMONIAE  Final      Susceptibility   Klebsiella pneumoniae - MIC*    AMPICILLIN RESISTANT      CEFAZOLIN <=4 SENSITIVE Sensitive     CEFTRIAXONE <=1 SENSITIVE Sensitive     CIPROFLOXACIN <=0.25 SENSITIVE Sensitive     GENTAMICIN <=1 SENSITIVE Sensitive     LEVOFLOXACIN <=0.12 SENSITIVE Sensitive     NITROFURANTOIN 32 SENSITIVE Sensitive     TOBRAMYCIN <=1 SENSITIVE Sensitive  TRIMETH/SULFA <=20 SENSITIVE Sensitive     PIP/TAZO <=4 SENSITIVE Sensitive     * KLEBSIELLA PNEUMONIAE     Labs: Basic Metabolic Panel:  Recent Labs Lab 02/20/15 1609 02/21/15 0047 02/22/15 0257  NA 139 137 142  K 3.8 3.2* 4.0  CL 102 104 111  CO2 GLUCOSE 105* 96 119*  BUN CREATININE 1.89* 1.49* 1.27*  CALCIUM 9.0 7.9* 8.3*   Liver Function Tests:  Recent Labs Lab 02/20/15 1609  AST 16  ALT 9  ALKPHOS 77  BILITOT 1.2  PROT 6.4  ALBUMIN 3.2*   No results for input(s): LIPASE, AMYLASE in the last 168 hours. No results for input(s): AMMONIA in the last 168 hours. CBC:  Recent Labs Lab 02/20/15 1609 02/21/15 0047 02/22/15 0257 02/23/15 0524  WBC 8.1 5.2 4.7 5.9  NEUTROABS 5.6  --   --   --   HGB 12.7 10.1* 10.4* 10.5*  HCT 39.3 30.8* 32.5* 32.4*  MCV 86.4 86.5 87.4 85.9  PLT 248 211 231 214   Cardiac  Enzymes:  Recent Labs Lab 02/20/15 1609 02/20/15 1957 02/21/15 0047 02/21/15 0745  TROPONINI <0.03 <0.03 <0.03 <0.03   BNP: BNP (last 3 results) No results for input(s): BNP in the last 8760 hours.  ProBNP (last 3 results) No results for input(s): PROBNP in the last 8760 hours.  CBG:  Recent Labs Lab 02/22/15 1123 02/22/15 1623 02/22/15 2146 02/23/15 0752 02/23/15 1139  GLUCAP 124* 105* 102* 107* 154*       Signed:  Rhetta Mura  [facility] 02/23/2015, 11:53 AM

## 2015-02-23 NOTE — Clinical Social Work Placement (Signed)
Clinical Social Work Department CLINICAL SOCIAL WORK PLACEMENT NOTE 02/23/2015  Patient:  Dawn StaggersMILLER,Paw R  Account Number:  [id number] Admit date:  02/20/2015  Clinical Social Worker:  Hortencia PilarKIERRA Kersten Salmons, CLINICAL SOCIAL WORKER  Date/time:  02/23/2015 11:34 AM  Clinical Social Work is seeking post-discharge placement for this patient at the following level of care:   SKILLED NURSING   (*CSW will update this form in Epic as items are completed)   02/23/2015  Patient/family provided with [name] Department of Clinical Social Work's list of facilities offering this level of care within the geographic area requested by the patient (or if unable, by the patient's family).  02/23/2015  Patient/family informed of their freedom to choose among providers that offer the needed level of care, that participate in Medicare, Medicaid or managed care program needed by the patient, have an available bed and are willing to accept the patient.  02/23/2015  Patient/family informed of [facility]' ownership interest in [facility], as well as of the fact that they are under no obligation to receive care at this facility.  PASARR submitted to EDS on 02/23/2015 PASARR number received on 02/23/2015  FL2 transmitted to all facilities in geographic area requested by pt/family on  02/23/2015 FL2 transmitted to all facilities within larger geographic area on   Patient informed that his/her managed care company has contracts with or will negotiate with  certain facilities, including the following:     Patient/family informed of bed offers received:   Patient chooses bed at  Physician recommends and patient chooses bed at    Patient to be transferred to  on   Patient to be transferred to facility by  Patient and family notified of transfer on  Name of family member notified:    The following physician request were entered in Epic:   Additional Comments:   Keylie Beavers S. Doss Cybulski, BSW-Intern

## 2015-02-23 NOTE — Clinical Social Work Psychosocial (Signed)
Clinical Social Work Department BRIEF PSYCHOSOCIAL ASSESSMENT 02/23/2015  Patient:  Dawn Bradshaw,Dawn Bradshaw     Account Number:  [id number]     Admit date:  02/20/2015  Clinical Social Worker:  Hortencia PilarWILEY,Javyon Fontan, CLINICAL SOCIAL WORKER  Date/Time:  02/23/2015 11:20 AM  Referred by:  Physician  Date Referred:  02/23/2015 Referred for  SNF Placement   Other Referral:   none.   Interview type:  Patient Other interview type:   none.    PSYCHOSOCIAL DATA Living Status:  FAMILY Admitted from facility:   Level of care:   Primary support name:  Lavetta NielsenLee Marsh Primary support relationship to patient:  CHILD, ADULT Degree of support available:   Adequate support.    CURRENT CONCERNS Current Concerns  Post-Acute Placement   Other Concerns:   none.    SOCIAL WORK ASSESSMENT / PLAN CSW and BSW-Intern consulted regarding SNF placement for pt once medically stable for discharge.    BSW-Intern contacted pt's son Dawn Hai(Lee) via phone to confirm that pt as well as pt's family had [facility]. Pt's son confirmed the information that was given.    CSW and BSW-Intern to contiue with discharge planning needs.   Assessment/plan status:  Psychosocial Support/Ongoing Assessment of Needs Other assessment/ plan:   none.   Information/referral to community resources:   Pt to be [facility] once medically stable for discharge.    PATIENT'S/FAMILY'S RESPONSE TO PLAN OF CARE: Pt and pt's family agreeable and understanding to CSW plan of care. Pt nor pt's family expressed any further questions or concerns at this time.       Claude MangesKierra S. Naelani Lafrance, BSW-Intern

## 2015-02-23 NOTE — Progress Notes (Signed)
Talked to Helmut MusterAlicia RN with Palliative Care and was informed that the son Antony ContrasBryn [phone]) wants to take his mother home with hospice care at discharge; TCT Judie GrieveBryan to offer hospice choice, Judie [facility]Bryan [facility]; clinical information fax[facility] [phone]( [phone]); Judie GrieveBryan stated that she has a hospital bed, wheel chair, 3:1, shower bench and a walker; B Shelba Flakehandler RN,BSN,MHA [phone]

## 2015-03-07 NOTE — Clinical Social Work Placement (Addendum)
    Clinical Social Work Department CLINICAL SOCIAL WORK PLACEMENT NOTE 02/23/2015  Patient:  Dawn Bradshaw,Dawn Bradshaw  Account Number:  [id number] Admit date:  02/20/2015  Clinical Social Worker:  Hortencia PilarKIERRA WILEY, CLINICAL SOCIAL WORKER  Date/time:  02/23/2015 11:34 AM  Clinical Social Work is seeking post-discharge placement for this patient at the following level of care:   SKILLED NURSING   (*CSW will update this form in Epic as items are completed)   02/23/2015  Patient/family provided with [facility] Department of Clinical Social Work's list of facilities offering this level of care within the geographic area requested by the patient (or if unable, by the patient's family).  02/23/2015  Patient/family informed of their freedom to choose among providers that offer the needed level of care, that participate in Medicare, Medicaid or managed care program needed by the patient, have an available bed and are willing to accept the patient.  02/23/2015  Patient/family informed of [facility] ownership interest in [facility], as well as of the fact that they are under no obligation to receive care at this facility.  PASARR submitted to EDS on 02/23/2015 PASARR number received on 02/23/2015  FL2 transmitted to all facilities in geographic area requested by pt/family on  02/23/2015 FL2 transmitted to all facilities within larger geographic area on   Patient informed that his/her managed care company has contracts with or will negotiate with  certain facilities, including the following:   [employer] Medicare- Auth required     Patient/family informed of bed offers received:   Patient chooses bed at  Physician recommends and patient chooses bed at    Patient to be transferred to  on  02/23/2015 Patient to be transferred to facility by Ambulance Patient and family notified of transfer on  Name of family member notified:    The following physician request were entered in  Epic:   Additional Comments: 02/23/15  [facility]; family has opted to take patient home with [facility].  CSW signing off.

## 2015-05-22 ENCOUNTER — Other Ambulatory Visit: Payer: Self-pay

## 2015-05-26 NOTE — Patient Outreach (Signed)
[facility]) Care Management  05/26/2015  Dawn Bradshaw 1927/02/14 [id number]   Referral from [facility] Tier 4 and assigned to George Inaavina Green, RN for patient outreach.  Shae Hinnenkamp L. Dorcas CarrowRhodie, AAS [facility] Care Management Assistant [phone]

## 2015-05-26 DEATH — deceased

## 2015-05-30 ENCOUNTER — Other Ambulatory Visit: Payer: Self-pay

## 2015-05-30 NOTE — Patient Outreach (Signed)
[facility]) Care Management  05/30/2015  Dawn Bradshaw 01/09/1927 [id number]   Telephone call to [name], patients son and POA as indicated in EPIC.  [name] states patient is deceased.  PLAN: RNCM will refer to [name] to close due to patient being deceased.  RNCM notified patients primary MD office, no [facility] care management services, patient deceased.   [name] RN,BSN,CCM [facility] Telephonic Care Coordinator [phone]

## 2015-06-02 NOTE — Patient Outreach (Signed)
[facility]) Care Management  06/02/2015  Dawn Bradshaw 02/27/1927 [id number]   Request from Dawn Inaavina Green, RN to close patient to Patient deceased per her son/ POA.  Dawn MckusickLisa O. Sharlee Bradshaw, [name] Phone: [phone] Fax: [phone]
